# Patient Record
Sex: Male | Born: 1935 | Race: White | Hispanic: No | Marital: Married | Smoking: Never smoker
Health system: Southern US, Community
[De-identification: ages and names within clinical notes are randomized; demographics above are authoritative.]

## PROBLEM LIST (undated history)

## (undated) DIAGNOSIS — G20 Parkinson's disease: Secondary | ICD-10-CM

## (undated) DIAGNOSIS — I10 Essential (primary) hypertension: Secondary | ICD-10-CM

## (undated) DIAGNOSIS — I959 Hypotension, unspecified: Secondary | ICD-10-CM

## (undated) DIAGNOSIS — M8580 Other specified disorders of bone density and structure, unspecified site: Secondary | ICD-10-CM

## (undated) DIAGNOSIS — E785 Hyperlipidemia, unspecified: Secondary | ICD-10-CM

## (undated) DIAGNOSIS — H47619 Cortical blindness, unspecified side of brain: Secondary | ICD-10-CM

## (undated) DIAGNOSIS — F0390 Unspecified dementia without behavioral disturbance: Secondary | ICD-10-CM

## (undated) DIAGNOSIS — G20A1 Parkinson's disease without dyskinesia, without mention of fluctuations: Secondary | ICD-10-CM

## (undated) DIAGNOSIS — I2510 Atherosclerotic heart disease of native coronary artery without angina pectoris: Secondary | ICD-10-CM

## (undated) DIAGNOSIS — I219 Acute myocardial infarction, unspecified: Secondary | ICD-10-CM

## (undated) HISTORY — DX: Acute myocardial infarction, unspecified: I21.9

## (undated) HISTORY — DX: Parkinson's disease without dyskinesia, without mention of fluctuations: G20.A1

## (undated) HISTORY — PX: TOTAL KNEE ARTHROPLASTY: SHX125

## (undated) HISTORY — DX: Essential (primary) hypertension: I10

## (undated) HISTORY — DX: Cortical blindness, unspecified side of brain: H47.619

## (undated) HISTORY — PX: CAROTID STENT: SHX1301

## (undated) HISTORY — DX: Other specified disorders of bone density and structure, unspecified site: M85.80

## (undated) HISTORY — DX: Hypotension, unspecified: I95.9

## (undated) HISTORY — DX: Atherosclerotic heart disease of native coronary artery without angina pectoris: I25.10

## (undated) HISTORY — DX: Hyperlipidemia, unspecified: E78.5

## (undated) HISTORY — DX: Parkinson's disease: G20

---

## 1994-12-22 HISTORY — PX: CORONARY ARTERY BYPASS GRAFT: SHX141

## 2002-12-22 DIAGNOSIS — I219 Acute myocardial infarction, unspecified: Secondary | ICD-10-CM

## 2002-12-22 HISTORY — DX: Acute myocardial infarction, unspecified: I21.9

## 2003-06-20 HISTORY — PX: CARDIAC CATHETERIZATION: SHX172

## 2003-07-31 HISTORY — PX: CORONARY ANGIOPLASTY WITH STENT PLACEMENT: SHX49

## 2003-12-23 LAB — HM COLONOSCOPY: HM Colonoscopy: NORMAL

## 2004-09-18 ENCOUNTER — Encounter: Admission: RE | Admit: 2004-09-18 | Discharge: 2004-09-18 | Payer: Self-pay | Admitting: Internal Medicine

## 2004-10-29 ENCOUNTER — Ambulatory Visit: Payer: Self-pay | Admitting: Internal Medicine

## 2005-01-02 ENCOUNTER — Ambulatory Visit: Payer: Self-pay | Admitting: Internal Medicine

## 2005-02-10 ENCOUNTER — Ambulatory Visit: Payer: Self-pay | Admitting: Internal Medicine

## 2005-06-10 ENCOUNTER — Ambulatory Visit: Payer: Self-pay | Admitting: Internal Medicine

## 2005-10-10 ENCOUNTER — Ambulatory Visit: Payer: Self-pay | Admitting: Internal Medicine

## 2006-02-13 ENCOUNTER — Ambulatory Visit: Payer: Self-pay | Admitting: Internal Medicine

## 2006-08-13 ENCOUNTER — Ambulatory Visit: Payer: Self-pay | Admitting: Internal Medicine

## 2006-09-24 ENCOUNTER — Ambulatory Visit: Payer: Self-pay | Admitting: Internal Medicine

## 2006-12-21 ENCOUNTER — Ambulatory Visit: Payer: Self-pay | Admitting: Internal Medicine

## 2007-02-23 ENCOUNTER — Ambulatory Visit: Payer: Self-pay | Admitting: Internal Medicine

## 2007-07-29 DIAGNOSIS — I2510 Atherosclerotic heart disease of native coronary artery without angina pectoris: Secondary | ICD-10-CM | POA: Insufficient documentation

## 2007-07-29 DIAGNOSIS — G20 Parkinson's disease: Secondary | ICD-10-CM

## 2007-07-29 DIAGNOSIS — I252 Old myocardial infarction: Secondary | ICD-10-CM | POA: Insufficient documentation

## 2007-07-29 DIAGNOSIS — M899 Disorder of bone, unspecified: Secondary | ICD-10-CM | POA: Insufficient documentation

## 2007-07-29 DIAGNOSIS — M949 Disorder of cartilage, unspecified: Secondary | ICD-10-CM

## 2007-08-12 ENCOUNTER — Encounter: Payer: Self-pay | Admitting: Internal Medicine

## 2007-08-26 ENCOUNTER — Ambulatory Visit: Payer: Self-pay | Admitting: Internal Medicine

## 2007-08-26 DIAGNOSIS — E785 Hyperlipidemia, unspecified: Secondary | ICD-10-CM | POA: Insufficient documentation

## 2007-08-26 DIAGNOSIS — I10 Essential (primary) hypertension: Secondary | ICD-10-CM | POA: Insufficient documentation

## 2007-08-27 LAB — CONVERTED CEMR LAB
ALT: 25 units/L (ref 0–53)
AST: 29 units/L (ref 0–37)
Albumin: 3.6 g/dL (ref 3.5–5.2)
Alkaline Phosphatase: 56 units/L (ref 39–117)
BUN: 14 mg/dL (ref 6–23)
Bilirubin, Direct: 0.1 mg/dL (ref 0.0–0.3)
CO2: 29 meq/L (ref 19–32)
Calcium: 9 mg/dL (ref 8.4–10.5)
Chloride: 103 meq/L (ref 96–112)
Cholesterol: 111 mg/dL (ref 0–200)
Creatinine, Ser: 0.8 mg/dL (ref 0.4–1.5)
GFR calc Af Amer: 122 mL/min
GFR calc non Af Amer: 101 mL/min
Glucose, Bld: 88 mg/dL (ref 70–99)
HDL: 44.6 mg/dL (ref >39.0)
LDL Cholesterol: 59 mg/dL (ref 0–99)
Potassium: 4.8 meq/L (ref 3.5–5.1)
Sodium: 136 meq/L (ref 135–145)
Total Bilirubin: 0.8 mg/dL (ref 0.3–1.2)
Total CHOL/HDL Ratio: 2.5
Total Protein: 6.5 g/dL (ref 6.0–8.3)
Triglycerides: 36 mg/dL (ref 0–149)
VLDL: 7 mg/dL (ref 0–40)

## 2007-09-15 ENCOUNTER — Encounter: Payer: Self-pay | Admitting: Internal Medicine

## 2007-12-28 ENCOUNTER — Encounter: Admission: RE | Admit: 2007-12-28 | Discharge: 2008-01-27 | Payer: Self-pay | Admitting: Neurology

## 2008-01-06 ENCOUNTER — Encounter: Payer: Self-pay | Admitting: Internal Medicine

## 2008-02-08 ENCOUNTER — Encounter: Payer: Self-pay | Admitting: Internal Medicine

## 2008-02-23 ENCOUNTER — Ambulatory Visit: Payer: Self-pay | Admitting: Internal Medicine

## 2008-02-28 ENCOUNTER — Ambulatory Visit: Payer: Self-pay | Admitting: Internal Medicine

## 2008-02-28 LAB — CONVERTED CEMR LAB
AST: 26 units/L (ref 0–37)
Albumin: 3.7 g/dL (ref 3.5–5.2)
Alkaline Phosphatase: 57 units/L (ref 39–117)
Calcium: 9.4 mg/dL (ref 8.4–10.5)
Chloride: 106 meq/L (ref 96–112)
Cholesterol: 113 mg/dL (ref 0–200)
GFR calc Af Amer: 85 mL/min
Glucose, Bld: 85 mg/dL (ref 70–99)
HDL: 39.3 mg/dL (ref >39.0)
Potassium: 4.5 meq/L (ref 3.5–5.1)
Sodium: 138 meq/L (ref 135–145)
Total Bilirubin: 0.9 mg/dL (ref 0.3–1.2)
VLDL: 7 mg/dL (ref 0–40)

## 2008-08-10 ENCOUNTER — Encounter: Payer: Self-pay | Admitting: Internal Medicine

## 2008-08-10 ENCOUNTER — Ambulatory Visit: Payer: Self-pay | Admitting: Internal Medicine

## 2008-08-10 DIAGNOSIS — Z8679 Personal history of other diseases of the circulatory system: Secondary | ICD-10-CM

## 2008-08-23 ENCOUNTER — Ambulatory Visit: Payer: Self-pay | Admitting: Internal Medicine

## 2008-09-18 ENCOUNTER — Encounter: Admission: RE | Admit: 2008-09-18 | Discharge: 2008-09-18 | Payer: Self-pay | Admitting: Neurology

## 2008-11-13 ENCOUNTER — Ambulatory Visit: Payer: Self-pay | Admitting: Internal Medicine

## 2008-12-05 ENCOUNTER — Ambulatory Visit: Payer: Self-pay | Admitting: Internal Medicine

## 2008-12-22 DIAGNOSIS — H47619 Cortical blindness, unspecified side of brain: Secondary | ICD-10-CM

## 2008-12-22 HISTORY — DX: Cortical blindness, unspecified side of brain: H47.619

## 2009-01-04 ENCOUNTER — Encounter: Payer: Self-pay | Admitting: Internal Medicine

## 2009-02-05 ENCOUNTER — Encounter: Payer: Self-pay | Admitting: Internal Medicine

## 2009-02-12 ENCOUNTER — Encounter: Payer: Self-pay | Admitting: Internal Medicine

## 2009-05-07 ENCOUNTER — Encounter: Payer: Self-pay | Admitting: Internal Medicine

## 2009-06-05 ENCOUNTER — Ambulatory Visit: Payer: Self-pay | Admitting: Internal Medicine

## 2009-06-05 DIAGNOSIS — M25579 Pain in unspecified ankle and joints of unspecified foot: Secondary | ICD-10-CM

## 2009-08-20 ENCOUNTER — Encounter: Payer: Self-pay | Admitting: Internal Medicine

## 2009-10-14 ENCOUNTER — Inpatient Hospital Stay (HOSPITAL_COMMUNITY): Admission: EM | Admit: 2009-10-14 | Discharge: 2009-10-16 | Payer: Self-pay | Admitting: Emergency Medicine

## 2009-10-15 ENCOUNTER — Encounter (INDEPENDENT_AMBULATORY_CARE_PROVIDER_SITE_OTHER): Payer: Self-pay | Admitting: Neurology

## 2009-11-12 ENCOUNTER — Ambulatory Visit: Payer: Self-pay | Admitting: Internal Medicine

## 2009-11-13 LAB — CONVERTED CEMR LAB
ALT: 11 units/L (ref 0–53)
Albumin: 3.8 g/dL (ref 3.5–5.2)
Alkaline Phosphatase: 54 units/L (ref 39–117)
Basophils Absolute: 0 10*3/uL (ref 0.0–0.1)
Cholesterol: 102 mg/dL (ref 0–200)
Eosinophils Relative: 3.3 % (ref 0.0–5.0)
HCT: 34.1 % — ABNORMAL LOW (ref 39.0–52.0)
HDL: 40 mg/dL (ref >39.00)
Hemoglobin: 11.6 g/dL — ABNORMAL LOW (ref 13.0–17.0)
LDL Cholesterol: 47 mg/dL (ref 0–99)
Lymphocytes Relative: 18.2 % (ref 12.0–46.0)
Lymphs Abs: 1 10*3/uL (ref 0.7–4.0)
MCHC: 34 g/dL (ref 30.0–36.0)
MCV: 101.1 fL — ABNORMAL HIGH (ref 78.0–100.0)
Platelets: 144 10*3/uL — ABNORMAL LOW (ref 150.0–400.0)
RDW: 13.4 % (ref 11.5–14.6)
TSH: 1.35 microintl units/mL (ref 0.35–5.50)
Total Bilirubin: 0.8 mg/dL (ref 0.3–1.2)
Total CHOL/HDL Ratio: 3
Total Protein: 6.3 g/dL (ref 6.0–8.3)
Triglycerides: 76 mg/dL (ref 0.0–149.0)
VLDL: 15.2 mg/dL (ref 0.0–40.0)
WBC: 5.4 10*3/uL (ref 4.5–10.5)

## 2009-11-20 ENCOUNTER — Encounter (INDEPENDENT_AMBULATORY_CARE_PROVIDER_SITE_OTHER): Payer: Self-pay | Admitting: *Deleted

## 2009-11-27 DIAGNOSIS — D649 Anemia, unspecified: Secondary | ICD-10-CM

## 2009-11-27 LAB — CONVERTED CEMR LAB
Folate: 8.6 ng/mL
Saturation Ratios: 18 % — ABNORMAL LOW (ref 20.0–50.0)

## 2009-11-28 ENCOUNTER — Encounter (INDEPENDENT_AMBULATORY_CARE_PROVIDER_SITE_OTHER): Payer: Self-pay | Admitting: *Deleted

## 2009-12-24 ENCOUNTER — Encounter: Payer: Self-pay | Admitting: Internal Medicine

## 2009-12-27 ENCOUNTER — Ambulatory Visit: Payer: Self-pay | Admitting: Gastroenterology

## 2009-12-27 ENCOUNTER — Telehealth: Payer: Self-pay | Admitting: Gastroenterology

## 2010-01-04 ENCOUNTER — Ambulatory Visit: Payer: Self-pay | Admitting: Internal Medicine

## 2010-01-10 ENCOUNTER — Ambulatory Visit: Payer: Self-pay | Admitting: Internal Medicine

## 2010-01-14 ENCOUNTER — Ambulatory Visit: Payer: Self-pay | Admitting: Gastroenterology

## 2010-01-16 ENCOUNTER — Telehealth: Payer: Self-pay | Admitting: Gastroenterology

## 2010-01-16 DIAGNOSIS — E538 Deficiency of other specified B group vitamins: Secondary | ICD-10-CM | POA: Insufficient documentation

## 2010-01-17 ENCOUNTER — Ambulatory Visit: Payer: Self-pay | Admitting: Gastroenterology

## 2010-01-24 ENCOUNTER — Ambulatory Visit: Payer: Self-pay | Admitting: Internal Medicine

## 2010-01-28 ENCOUNTER — Encounter: Payer: Self-pay | Admitting: Internal Medicine

## 2010-01-31 ENCOUNTER — Encounter: Payer: Self-pay | Admitting: Internal Medicine

## 2010-02-14 LAB — CONVERTED CEMR LAB
Basophils Absolute: 0 10*3/uL (ref 0.0–0.1)
Eosinophils Absolute: 0.2 10*3/uL (ref 0.0–0.7)
MCHC: 33 g/dL (ref 30.0–36.0)
MCV: 99.7 fL (ref 78.0–100.0)
Monocytes Absolute: 0.7 10*3/uL (ref 0.1–1.0)
Neutro Abs: 3.6 10*3/uL (ref 1.4–7.7)

## 2010-02-21 ENCOUNTER — Ambulatory Visit: Payer: Self-pay | Admitting: Internal Medicine

## 2010-02-21 LAB — CONVERTED CEMR LAB
Albumin ELP: 58.1 % (ref 55.8–66.1)
Alpha-1-Globulin: 5 % — ABNORMAL HIGH (ref 2.9–4.9)
Alpha-2-Globulin: 11.4 % (ref 7.1–11.8)
Total Protein, Serum Electrophoresis: 6.6 g/dL (ref 6.0–8.3)

## 2010-02-28 ENCOUNTER — Ambulatory Visit: Payer: Self-pay | Admitting: Internal Medicine

## 2010-03-19 ENCOUNTER — Ambulatory Visit: Payer: Self-pay | Admitting: Internal Medicine

## 2010-03-21 ENCOUNTER — Encounter: Payer: Self-pay | Admitting: Internal Medicine

## 2010-03-22 ENCOUNTER — Telehealth: Payer: Self-pay | Admitting: Internal Medicine

## 2010-04-04 ENCOUNTER — Encounter: Admission: RE | Admit: 2010-04-04 | Discharge: 2010-05-30 | Payer: Self-pay | Admitting: *Deleted

## 2010-04-06 ENCOUNTER — Encounter: Payer: Self-pay | Admitting: Internal Medicine

## 2010-06-20 ENCOUNTER — Encounter: Payer: Self-pay | Admitting: Internal Medicine

## 2010-09-12 ENCOUNTER — Ambulatory Visit: Payer: Self-pay | Admitting: Internal Medicine

## 2010-12-12 ENCOUNTER — Encounter: Payer: Self-pay | Admitting: Internal Medicine

## 2011-01-22 NOTE — Assessment & Plan Note (Signed)
Summary: 2 WK ROV // RS   Vital Signs:  Patient profile:   77 year old male Weight:      137 pounds Pulse rate:   84 / minute Pulse rhythm:   regular Resp:     12 per minute BP sitting:   86 / 56  (left arm)  Vitals Entered By: Gladis Riffle, RN (March 19, 2010 1:22 PM) CC: 2 week rov--states hallucinations are improved, but balance unsteady and seems tired--has fallen x 2 Is Patient Diabetic? No Comments sleeping with alprazolam, but wets bed   Primary Care Provider:  Birdie Sons, MD  CC:  2 week rov--states hallucinations are improved and but balance unsteady and seems tired--has fallen x 2.  History of Present Illness: he is feeling much better confirmed with wife essentially no hallucinations mood better staying awake during the day sleeping better at night see previous note form medication adjustments  All other systems reviewed and were negative   Preventive Screening-Counseling & Management  Alcohol-Tobacco     Smoking Status: never  Current Problems (verified): 1)  B12 Deficiency  (ICD-266.2) 2)  Rectal Bleeding  (ICD-569.3) 3)  Anemia  (ICD-285.9) 4)  Ankle Pain  (ICD-719.47) 5)  Hypotension, Orthostatic, Hx of  (ICD-V12.50) 6)  Hypertension  (ICD-401.9) 7)  Hyperlipidemia  (ICD-272.4) 8)  Parkinson's Disease  (ICD-332.0) 9)  Solar Keratosis  (ICD-702.0) 10)  Osteopenia  (ICD-733.90) 11)  Myocardial Infarction, Hx of  (ICD-412) 12)  Coronary Artery Disease  (ICD-414.00)  Current Medications (verified): 1)  Aspirin 81 Mg  Tabs (Aspirin) .... Take 1 Tablet By Mouth Once A Day 2)  Plavix 75 Mg  Tabs (Clopidogrel Bisulfate) .... Take 1 Tablet By Mouth Once A Day 3)  Sinemet 25-100 Mg Tabs (Carbidopa-Levodopa) .... Take 1 Tablet By Mouth Three Times A Day 4)  Alprazolam 0.25 Mg Tabs (Alprazolam) .Marland Kitchen.. 1-2 At Bedtime 5)  Nascobal 500 Mcg/0.69ml Soln (Cyanocobalamin) .... One Spray, One Nostril, Once A Week 6)  Vitamin C 500 Mg Tabs (Ascorbic Acid) .... Once  Daily 7)  Lisinopril 5 Mg Tabs (Lisinopril) .Marland Kitchen.. 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   Head:  normocephalic and atraumatic.   Eyes:  pupils equal and pupils round.   Neck:  supple, full ROM, and no masses.   Chest Wall:  no deformities, no tenderness, and no masses.   Lungs:  normal respiratory effort and no intercostal retractions.   Heart:  normal rate and regular rhythm.   Abdomen:  soft and non-tender.   Msk:  No deformity or scoliosis noted of thoracic or lumbar spine.   Neurologic:  flat facies  resitng hand tremor slow, shuffling gait alert and oriented to name place, time Skin:  turgor normal and color normal.   Psych:  normally interactive and good  eye contact.     Impression & Recommendations:  Problem # 1:  PARKINSON'S DISEASE (ICD-332.0) sxs are improved even on lesser dose of sinemet sxs related to meds (hallucinations, etc) resolved will leve the meds the same for now---see me one month  discussed with patient and wife  Complete Medication List: 1)  Aspirin 81 Mg Tabs (Aspirin) .... Take 1 tablet by mouth once a day 2)  Plavix 75  Mg Tabs (Clopidogrel bisulfate) .... Take 1 tablet by mouth once a day 3)  Sinemet 25-100 Mg Tabs (Carbidopa-levodopa) .... Take 1 tablet by mouth three times a day 4)  Alprazolam 0.25 Mg Tabs (Alprazolam) .Marland Kitchen.. 1-2 at bedtime 5)  Nascobal 500 Mcg/0.14ml Soln (Cyanocobalamin) .... One spray, one nostril, once a week 6)  Vitamin C 500 Mg Tabs (Ascorbic acid) .... Once daily 7)  Lisinopril 5 Mg Tabs (Lisinopril) .Marland Kitchen.. 1 tablet by mouth daily

## 2011-01-22 NOTE — Assessment & Plan Note (Signed)
Summary: B12 (#3 of 3 weekly inj)dfs  Nurse Visit   Allergies: No Known Drug Allergies  Medication Administration  Injection # 1:    Medication: Vit B12 1000 mcg    Diagnosis: B12 DEFICIENCY (ICD-266.2)    Route: IM    Site: R deltoid    Exp Date: 09/22/2011    Lot #: [id number]    Mfr: American Regent    Patient tolerated injection without complications    Given by: Harlow Mares CMA (AAMA) (January 17, 2010 9:14 AM)  Orders Added: 1)  Vit B12 1000 mcg [J3420] Prescriptions: NASCOBAL 500 MCG/0.1ML SOLN (CYANOCOBALAMIN) one spray, one nostril, once a week  #1 bottle x 6   Entered by:   Harlow Mares CMA (AAMA)   Authorized by:   Mardella Layman MD [facility]   Signed by:   Harlow Mares CMA (AAMA) on 01/17/2010   Method used:   Print then Give to Patient   RxID:   [id number]

## 2011-01-22 NOTE — Procedures (Signed)
Summary: Colonoscopy  Patient: Keno Caraway Note: All result statuses are Final unless otherwise noted.  Tests: (1) Colonoscopy (COL)   COL Colonoscopy           DONE     [facility]     [address]     [place], [place]  [address]           COLONOSCOPY PROCEDURE REPORT           PATIENT:  Everton, Bertha  MR#:  [id number]     BIRTHDATE:  [birth year], 76 yrs. old  GENDER:  male           ENDOSCOPIST:  Vania Rea. Jarold Motto, MD, [facility]     Referred by:           PROCEDURE DATE:  01/14/2010     PROCEDURE:  Colonoscopy, Diagnostic     ASA CLASS:  Class III     INDICATIONS:  Iron Deficiency Anemia, rectal bleeding           MEDICATIONS:   Fentanyl 50 mcg IV, Versed 6 mg           DESCRIPTION OF PROCEDURE:   After the risks benefits and     alternatives of the procedure were thoroughly explained, informed     consent was obtained.  Digital rectal exam was performed and     revealed no abnormalities.   The LB [id number] endoscope     was introduced through the anus and advanced to the cecum, which     was identified by both the appendix and ileocecal valve, without     limitations.  The quality of the prep was excellent, using     MoviPrep.  The instrument was then slowly withdrawn as the colon     was fully examined.     <<PROCEDUREIMAGES>>           FINDINGS:  Scattered diverticula were found sigmoid to descending     No polyps or cancers were seen.  This was otherwise a normal     examination of the colon.   Retroflexed views in the rectum     revealed hemorrhoids.    The scope was then withdrawn from the     patient and the procedure completed.           COMPLICATIONS:  None           ENDOSCOPIC IMPRESSION:     1) Diverticula, scattered in the sigmoid to descending     2) No polyps or cancers     3) Otherwise normal examination     4) Hemorrhoids     Pt.is on Plavix.will resume.     RECOMMENDATIONS:     1) high fiber diet     prn local anal care.        REPEAT EXAM:  No           ______________________________     Vania Rea. Jarold Motto, MD, [facility]           CC:  Lindley Magnus, MD           n.     Rosalie DoctorVania Rea. Humphrey Guerreiro at 01/14/2010 09:03 AM           Rachel Moulds, [id number]  Note: An exclamation mark (!) indicates a result that was not dispersed into the flowsheet. Document Creation Date: 01/14/2010 9:03 AM _______________________________________________________________________  (1) Order result status: Final Collection or observation date-time:  01/14/2010 08:55 Requested date-time:  Receipt date-time:  Reported date-time:  Referring Physician:   Ordering Physician: Sheryn Bison [phone]) Specimen Source:  Source: Launa Grill Order Number: [id number] Lab site:

## 2011-01-22 NOTE — Assessment & Plan Note (Signed)
Summary: B-12 INJ/RCD  Nurse Visit   Allergies: No Known Drug Allergies  Medication Administration  Injection # 1:    Medication: Vit B12 1000 mcg    Diagnosis: ANEMIA (ICD-285.9)    Route: IM    Site: L deltoid    Exp Date: 07/23/2011    Lot #: [id number]    Mfr: American Regent    Patient tolerated injection without complications    Given by: Kern Reap CMA (AAMA) (January 10, 2010 2:58 PM)  Orders Added: 1)  Vit B12 1000 mcg [J3420] 2)  Admin of Therapeutic Inj  intramuscular or subcutaneous [96372]  Appended Document: B-12 INJ/RCD after shots would Rx. weekly Nascobal B12 spray...  Appended Document: B-12 INJ/RCD Wife notified.  Rx sent for nascobal.   Clinical Lists Changes  Medications: Added new medication of NASCOBAL 500 MCG/0.1ML SOLN (CYANOCOBALAMIN) 1 spray weekly - Signed Rx of NASCOBAL 500 MCG/0.1ML SOLN (CYANOCOBALAMIN) 1 spray weekly;  #3 x 3;  Signed;  Entered by: Ashok Cordia RN;  Authorized by: Mardella Layman MD [facility];  Method used: [facility] to [facility]  [phone]*, [address], [place], [place]  [address], Ph: [phone] or [phone], Fax: [phone]    Prescriptions: NASCOBAL 500 MCG/0.1ML SOLN (CYANOCOBALAMIN) 1 spray weekly  #3 x 3   Entered by:   Ashok Cordia RN   Authorized by:   Mardella Layman MD [facility]   Signed by:   Ashok Cordia RN on 01/16/2010   Method used:   [facility] to        [facility]  [phone]* (retail)       [address] [place], [place]  [address]       Ph: [phone] or [phone]       Fax: [phone]   RxID:   [id number]

## 2011-01-22 NOTE — Letter (Signed)
Summary: Attending Physician's Statement/[employer]  Attending Physician's Statement/[employer]   Imported By: Maryln Gottron 03/26/2010 10:55:23  _____________________________________________________________________  External Attachment:    Type:   Image     Comment:   External Document

## 2011-01-22 NOTE — Letter (Signed)
Summary: [facility] Instructions  [facility]  [address] [place], [place] [address]   Phone: [phone]  Fax: [phone]       HEMI CHACKO    [birth year]    MRN: [id number]        Procedure Day /Date: Monday 01/14/10     Arrival Time: 7:30      Procedure Time:8:30     Location of Procedure:                    _X _  [facility] (4th Floor)                        PREPARATION FOR COLONOSCOPY WITH MOVIPREP   Starting 5 days prior to your procedure 01/09/10 do not eat nuts, seeds, popcorn, corn, beans, peas,  salads, or any raw vegetables.  Do not take any fiber supplements (e.g. Metamucil, Citrucel, and Benefiber).  THE DAY BEFORE YOUR PROCEDURE         DATE: 01/13/10  DAY: Sunday  1.  Drink clear liquids the entire day-NO SOLID FOOD  2.  Do not drink anything colored red or purple.  Avoid juices with pulp.  No orange juice.  3.  Drink at least 64 oz. (8 glasses) of fluid/clear liquids during the day to prevent dehydration and help the prep work efficiently.  CLEAR LIQUIDS INCLUDE: Water Jello Ice Popsicles Tea (sugar ok, no milk/cream) Powdered fruit flavored drinks Coffee (sugar ok, no milk/cream) Gatorade Juice: apple, white grape, white cranberry  Lemonade Clear bullion, consomm, broth Carbonated beverages (any kind) Strained chicken noodle soup Hard Candy                             4.  In the morning, mix first dose of MoviPrep solution:    Empty 1 Pouch A and 1 Pouch B into the disposable container    Add lukewarm drinking water to the top line of the container. Mix to dissolve    Refrigerate (mixed solution should be used within 24 hrs)  5.  Begin drinking the prep at 5:00 p.m. The MoviPrep container is divided by 4 marks.   Every 15 minutes drink the solution down to the next mark (approximately 8 oz) until the full liter is complete.   6.  Follow completed prep with 16 oz of clear liquid of your choice (Nothing red or  purple).  Continue to drink clear liquids until bedtime.  7.  Before going to bed, mix second dose of MoviPrep solution:    Empty 1 Pouch A and 1 Pouch B into the disposable container    Add lukewarm drinking water to the top line of the container. Mix to dissolve    Refrigerate  THE DAY OF YOUR PROCEDURE      DATE: 01/14/10 DAY: Monday  Beginning at 3:30a.m. (5 hours before procedure):         1. Every 15 minutes, drink the solution down to the next mark (approx 8 oz) until the full liter is complete.  2. Follow completed prep with 16 oz. of clear liquid of your choice.    3. You may drink clear liquids until 6"30 (2 HOURS BEFORE PROCEDURE).   MEDICATION INSTRUCTIONS  Unless otherwise instructed, you should take regular prescription medications with a small sip of water   as early as possible the morning of your procedure.  Stop taking Plavix on _________ .  We will check with Dr. Allyson Sabal about this.  (5 days before procedure).  You will be contacted by our office prior to your procedure for directions on holding your Plavix.  If you do not hear from our office 1 week prior to your scheduled procedure, please call [phone] to discuss.           OTHER INSTRUCTIONS  You will need a responsible adult at least 75 years of age to accompany you and drive you home.   This person must remain in the waiting room during your procedure.  Wear loose fitting clothing that is easily removed.  Leave jewelry and other valuables at home.  However, you may wish to bring a book to read or  an iPod/MP3 player to listen to music as you wait for your procedure to start.  Remove all body piercing jewelry and leave at home.  Total time from sign-in until discharge is approximately 2-3 hours.  You should go home directly after your procedure and rest.  You can resume normal activities the  day after your procedure.  The day of your procedure you should not:   Drive   Make legal  decisions   Operate machinery   Drink alcohol   Return to work  You will receive specific instructions about eating, activities and medications before you leave.    The above instructions have been reviewed and explained to me by   _______________________    I fully understand and can verbalize these instructions _____________________________ Date _________

## 2011-01-22 NOTE — Progress Notes (Signed)
Summary: B-12 shot  Phone Note Call from Patient Call back at Home Phone [phone]   Caller: Patient Call For: Dr. Jarold Motto Reason for Call: Talk to Nurse Summary of Call: Pt. is wanting to know if he can come in tomorrow for a B12 shot. He usually has it done closer to there house and they cannot do it until later Initial call taken by: Karna Christmas,  January 16, 2010 9:26 AM  Follow-up for Phone Call        Appt sch for pt to get B12 tomorrow.  last of 3 weekly injections.  PT will begin nascobal. Follow-up by: Ashok Cordia RN,  January 16, 2010 9:32 AM  New Problems: B12 DEFICIENCY (ICD-266.2)   New Problems: B12 DEFICIENCY (ICD-266.2)

## 2011-01-22 NOTE — Progress Notes (Signed)
Summary: Plavix and colonoscopy  Phone Note Outgoing Call   Summary of Call: Called Dr. Hazle Coca office.  Spoke with nurse re Plavix instructions and colonoscopy.  Nurse will call back and fax written instructions.   Need to set up B12 inj weekly x3. Initial call taken by: Ashok Cordia RN,  December 27, 2009 12:55 PM  Follow-up for Phone Call        Per note from Dr. Allyson Sabal.  May hold Plavix for 5 days prior to proc and restart asap.  Left message for pt to call.  Lupita Leash Surface RN  December 28, 2009 3:58 PM  Spoke with wife.  Informed of Dr. Hazle Coca instructions re Plavix.  Wife asks if pt can get B12 inj at Dr. Rolan Lipa' office.   Much more convenient for pt.   Follow-up by: Ashok Cordia RN,  December 31, 2009 9:39 AM  Additional Follow-up for Phone Call Additional follow up Details #1::        Called Dr. Marliss Coots office.  Fax order to Gladis Riffle at [phone].   Lupita Leash Surface RN  December 31, 2009 9:44 AM  Order faxed to Dr. Cato Mulligan office.  WIfe notified.   Additional Follow-up by: Ashok Cordia RN,  December 31, 2009 9:53 AM

## 2011-01-22 NOTE — Progress Notes (Signed)
Summary: Medications, Blood Pressures, Problems per patient  Medications, Blood Pressures, Problems per patient   Imported By: Maryln Gottron 03/21/2010 15:25:06  _____________________________________________________________________  External Attachment:    Type:   Image     Comment:   External Document

## 2011-01-22 NOTE — Letter (Signed)
Summary: [facility]  [facility]   Imported By: Maryln Gottron 01/03/2010 14:19:33  _____________________________________________________________________  External Attachment:    Type:   Image     Comment:   External Document

## 2011-01-22 NOTE — Letter (Signed)
Summary: [facility] at [facility]   Imported By: Maryln Gottron 07/12/2010 10:22:52  _____________________________________________________________________  External Attachment:    Type:   Image     Comment:   External Document

## 2011-01-22 NOTE — Assessment & Plan Note (Signed)
Summary: B12 INJ // RS  Nurse Visit   Allergies: No Known Drug Allergies  Medication Administration  Injection # 1:    Medication: Vit B12 1000 mcg    Diagnosis: ANEMIA (ICD-285.9)    Route: IM    Site: R deltoid    Exp Date: 07/23/2011    Lot #: [id number]    Mfr: American Regent    Patient tolerated injection without complications    Given by: Gladis Riffle, RN (January 04, 2010 3:15 PM)  Orders Added: 1)  Vit B12 1000 mcg [J3420] 2)  Admin of Therapeutic Inj  intramuscular or subcutaneous [96372]   Medication Administration  Injection # 1:    Medication: Vit B12 1000 mcg    Diagnosis: ANEMIA (ICD-285.9)    Route: IM    Site: R deltoid    Exp Date: 07/23/2011    Lot #: [id number]    Mfr: American Regent    Patient tolerated injection without complications    Given by: Gladis Riffle, RN (January 04, 2010 3:15 PM)  Orders Added: 1)  Vit B12 1000 mcg [J3420] 2)  Admin of Therapeutic Inj  intramuscular or subcutaneous [[id number]]

## 2011-01-22 NOTE — Assessment & Plan Note (Signed)
Summary: ankle pain//ccm   Vital Signs:  Patient profile:   77 year old male Height:      67 inches (170.18 cm) Weight:      135 pounds (61.36 kg) Temp:     98.6 degrees F (37.00 degrees C) oral Pulse rate:   82 / minute BP sitting:   90 / 60  (left arm) Cuff size:   regular  Vitals Entered By: Josph Macho RMA (September 12, 2010 11:47 AM) CC: right Ankle pain Xseveral months/ CF Is Patient Diabetic? No   Primary Care Provider:  Birdie Sons, MD  CC:  right Ankle pain Xseveral months/ CF.  History of Present Illness: patient comes in with right ankle discomfort. This has been going on for quite some time. This is not been extensively evaluated or treated prior because of difficulty with Parkinson's disease and medication side effects. He is doing much better from a Parkinson's point of view. He is being followed at [facility]. Tolerating medications.  In regards to his ankle, he reports right-sided ankle pain. Mostly laterally. Increases with walking. He does not recall any trauma. No extensive swelling or erythema noted. No history of gout.  All other systems reviewed and were negative   Current Problems (verified): 1)  B12 Deficiency  (ICD-266.2) 2)  Anemia  (ICD-285.9) 3)  Ankle Pain  (ICD-719.47) 4)  Hypotension, Orthostatic, Hx of  (ICD-V12.50) 5)  Hypertension  (ICD-401.9) 6)  Hyperlipidemia  (ICD-272.4) 7)  Parkinson's Disease  (ICD-332.0) 8)  Solar Keratosis  (ICD-702.0) 9)  Osteopenia  (ICD-733.90) 10)  Myocardial Infarction, Hx of  (ICD-412) 11)  Coronary Artery Disease  (ICD-414.00)  Current Medications (verified): 1)  Aspirin 81 Mg  Tabs (Aspirin) .... Take 1 Tablet By Mouth Once A Day 2)  Plavix 75 Mg  Tabs (Clopidogrel Bisulfate) .... Take 1 Tablet By Mouth Once A Day 3)  Sinemet 25-100 Mg Tabs (Carbidopa-Levodopa) .... Take 1 Tablet By Mouth Three Times A Day 4)  Vitamin C 500 Mg Tabs (Ascorbic Acid) .... Once Daily 5)  Lisinopril 5 Mg  Tabs (Lisinopril) .Marland Kitchen.. 1 Tablet By Mouth Daily 6)  Rivastagmine 3mg  .... Two Times A Day 7)  Lipitor 80 Mg Tabs (Atorvastatin Calcium) .Marland Kitchen.. 1 At Bedtime 8)  Niaspan 500 Mg Cr-Tabs (Niacin (Antihyperlipidemic)) .Marland Kitchen.. 1 At Bedtime 9)  Mirtazapine 15 Mg Tabs (Mirtazapine) .Marland Kitchen.. 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 no    Any prior history of allergic reactions to egg and/or gelatin? no    Do you have a sensitivity to the preservative Thimersol? no    Do you have a past history of Guillan-Barre Syndrome? no    Do you currently have an acute febrile illness? no    Have you ever had a severe reaction to latex? no  Vaccine information given and explained to patient? yes    Are you currently pregnant? no    Lot Number:[id number]   Exp Date:06/21/2011   Site Given  Left Deltoid IM Josph Macho RMA  September 12, 2010 11:48 AM   Physical Exam  General:  well-developed well-nourished male in no acute distress. He has a resting tremor. Speech is somewhat soft and stuttered. HEENT exam atraumatic, normocephalic. Neck is supple. Chest clear to auscultation without increased  work of breathing. Extremities he has full range of motion of both ankles. No significant swelling or erythema noted. No tenderness to palpation.   Impression & Recommendations:  Problem # 1:  ANKLE PAIN (ICD-719.47) ankle pain unclear etiology. Will try to treat with brace call if sxs persist reviewed xray with pt and wife  Complete Medication List: 1)  Aspirin 81 Mg Tabs (Aspirin) .... Take 1 tablet by mouth once a day 2)  Plavix 75 Mg Tabs (Clopidogrel bisulfate) .... Take 1 tablet by mouth once a day 3)  Sinemet 25-100 Mg Tabs (Carbidopa-levodopa) .... Take 1 tablet by mouth three times a day 4)  Vitamin C 500 Mg Tabs (Ascorbic acid) .... Once daily 5)  Lisinopril 5 Mg Tabs (Lisinopril) .Marland Kitchen.. 1 tablet by mouth daily 6)  Rivastagmine 3mg   .... Two times a day 7)  Lipitor 80 Mg Tabs (Atorvastatin calcium) .Marland Kitchen.. 1 at bedtime 8)  Niaspan 500 Mg Cr-tabs (Niacin (antihyperlipidemic)) .Marland Kitchen.. 1 at bedtime 9)  Mirtazapine 15 Mg Tabs (Mirtazapine) .Marland Kitchen.. 1 at bedtime  Other Orders: Flu Vaccine 16yrs + MEDICARE PATIENTS (Z6109) Administration Flu vaccine - MCR (G0008)

## 2011-01-22 NOTE — Letter (Signed)
Summary: [facility]  [facility]   Imported By: Maryln Gottron 02/05/2010 13:49:28  _____________________________________________________________________  External Attachment:    Type:   Image     Comment:   External Document

## 2011-01-22 NOTE — Letter (Signed)
Summary: [facility] at [facility] at [facility]   Imported By: Maryln Gottron 04/11/2010 11:31:51  _____________________________________________________________________  External Attachment:    Type:   Image     Comment:   External Document

## 2011-01-22 NOTE — Progress Notes (Signed)
Summary: Medication change   LMTCB 03/27/2010  Phone Note Call from Patient Call back at Home Phone [phone]   Caller: Spouse Reason for Call: Talk to Nurse Summary of Call: Patient's spouse states Dr. Cato Mulligan was going to call her with a possible change in medication after consulting with patient's neurologist.  Please call patient Initial call taken by: Everrett Coombe,  March 22, 2010 2:22 PM  Follow-up for Phone Call        wife cb she is aware dr Bristyn Kulesza out of office. Follow-up by: Heron Sabins,  March 22, 2010 2:56 PM  Additional Follow-up for Phone Call Additional follow up Details #1::        call patient's wife , see how he is doing. If doing well I would like to leave meds the same  pt's wife notified. Lynann Beaver [facility]  March 29, 2010 2:13 PM' Additional Follow-up by: Birdie Sons MD,  March 27, 2010 3:52 PM    Additional Follow-up for Phone Call Additional follow up Details #2::    [facility]  LMTCB [facility] CMA  March 29, 2010 11:35 AM  Follow-up by: Lynann Beaver CMA,  March 27, 2010 4:04 PM

## 2011-01-22 NOTE — Assessment & Plan Note (Signed)
Summary: ANEMIA/CH.   History of Present Illness Visit Type: consult Primary GI MD: Sheryn Bison MD FACP FAGA Primary Provider: Birdie Sons, MD Chief Complaint: Anemia, rectal bleeding History of Present Illness:   [name] is a 77 year old white male with advancing Parkinson's disease, severe coronary artery disease with previous stenting with metal stents, hypertension, and severe osteoarthritis with previous bilateral knee replacements. He is referred today by Dr. Cato Mulligan for evaluation of mild iron deficiency anemia with a hemoglobin of 11.6, serum ferritin of 65, iron saturation of 18%, and a low serum B12 228 with a macrocytic red cell pattern.  Patient has rather frequent bright red blood per rectum and is on Plavix and aspirin. He had a negative colonoscopy 10 years ago in [place]. He denies abdominal pain or other gastrointestinal symptomatology or any history of pancreatitis, hepatitis, or hepatobiliary symptomatology. His appetite is good and his weight is stable. Family history is noncontributory.  He is followed closely by neurology and Dr. Anne Hahn for advancing work as his disease and is on Requip and Sinemet. He seem by Dr. Gery Pray in cardiology and is on atenolol 25 mg a day, Plavix 75 mg a day, lisinopril 5 mg a day, and alprazolam at bedtime. He denies current cardiovascular or pulmonary symptomatology or symptoms of anemia.   GI Review of Systems      Denies abdominal pain, acid reflux, belching, bloating, chest pain, dysphagia with liquids, dysphagia with solids, heartburn, loss of appetite, nausea, vomiting, vomiting blood, weight loss, and  weight gain.      Reports hemorrhoids and  rectal bleeding.     Denies anal fissure, black tarry stools, change in bowel habit, constipation, diarrhea, diverticulosis, fecal incontinence, heme positive stool, irritable bowel syndrome, jaundice, light color stool, liver problems, and  rectal pain.    Current Medications  (verified): 1)  Atenolol 25 Mg  Tabs (Atenolol) .... Take 1 Tablet By Mouth At Bedtime 2)  Lipitor 80 Mg  Tabs (Atorvastatin Calcium) .... Take 1 Tablet By Mouth Once A Day 3)  Aspirin 81 Mg  Tabs (Aspirin) .... Take 1 Tablet By Mouth Once A Day 4)  Plavix 75 Mg  Tabs (Clopidogrel Bisulfate) .... Take 1 Tablet By Mouth Once A Day 5)  Requip 3 Mg Tabs (Ropinirole Hcl) .... Take 1 Tablet By Mouth Three Times A Day 6)  Niaspan 500 Mg  Tbcr (Niacin (Antihyperlipidemic)) .... Take 1 Tablet By Mouth At Bedtime 7)  Lisinopril 5 Mg Tabs (Lisinopril) .... Take 1 Tablet By Mouth Once A Day 8)  Sinemet Cr 50-200 Mg Cr-Tabs (Carbidopa-Levodopa) .... Take 1 Tablet By Mouth Three Times A Day 9)  Alprazolam 0.25 Mg Tabs (Alprazolam) .Marland Kitchen.. 1-2 At Bedtime 10)  Allergy Shots .... Weekly  Allergies (verified): No Known Drug Allergies  Past History:  Past medical, surgical, family and social histories (including risk factors) reviewed for relevance to current acute and chronic problems.  Past Medical History: Reviewed history from 08/10/2008 and no changes required. Coronary artery disease Myocardial infarction, hx of Osteopenia Hyperlipidemia Hypertension Parkinson's disease  Past Surgical History: Reviewed history from 07/29/2007 and no changes required. Coronary artery bypass graft Bilat Knee replacement Stent x3  Family History: Reviewed history from 07/29/2007 and no changes required. Family History of Arthritis Family History Hypertension Family History of Cardiovascular disorder  Social History: Reviewed history from 07/29/2007 and no changes required. Retired-Chemist Married, 2 girls Never Smoked Alcohol use-yes Drug use-no Regular exercise-yes  Review of Systems General:  Complains of  sleep disorder; denies fever, chills, sweats, anorexia, fatigue, weakness, malaise, and weight loss. Eyes:  Complains of blurring; denies diplopia, irritation, discharge, vision loss, scotoma, eye  pain, and photophobia. ENT:  Complains of decreased hearing and hoarseness; denies earache, ear discharge, tinnitus, nasal congestion, loss of smell, nosebleeds, sore throat, and difficulty swallowing. CV:  Denies chest pains, angina, palpitations, syncope, dyspnea on exertion, orthopnea, PND, peripheral edema, and claudication. Resp:  Denies dyspnea at rest, dyspnea with exercise, cough, sputum, wheezing, coughing up blood, and pleurisy. GI:  Denies difficulty swallowing, pain on swallowing, nausea, indigestion/heartburn, vomiting, vomiting blood, abdominal pain, jaundice, gas/bloating, diarrhea, constipation, change in bowel habits, bloody BM's, black BMs, and fecal incontinence. GU:  Denies urinary burning, blood in urine, urinary frequency, urinary hesitancy, nocturnal urination, urinary incontinence, penile discharge, genital sores, decreased libido, and erectile dysfunction. MS:  Denies joint pain / LOM, joint swelling, joint stiffness, joint deformity, low back pain, muscle weakness, muscle cramps, muscle atrophy, leg pain at night, leg pain with exertion, and shoulder pain / LOM hand / wrist pain (CTS); previous bilateral knee replacements in [place]. Derm:  Complains of itching; denies rash, dry skin, hives, moles, warts, and unhealing ulcers; positive for easy bruising.. Neuro:  Complains of weakness; denies paralysis, abnormal sensation, seizures, syncope, tremors, vertigo, transient blindness, frequent falls, frequent headaches, difficulty walking, headache, sciatica, radiculopathy other:, restless legs, memory loss, and confusion. Psych:  Complains of memory loss; denies depression, anxiety, suicidal ideation, hallucinations, paranoia, phobia, and confusion. Endo:  Denies cold intolerance, heat intolerance, polydipsia, polyphagia, polyuria, unusual weight change, and hirsutism. Heme:  Complains of bruising; denies bleeding, enlarged lymph nodes, and pagophagia. Allergy:  Complains  of hay fever; denies hives, rash, sneezing, and recurrent infections.  Vital Signs:  Patient profile:   77 year old male Height:      67 inches Weight:      143 pounds BMI:     22.48 Pulse rate:   64 / minute Pulse rhythm:   regular BP sitting:   80 / 52  (left arm) Cuff size:   regular  Vitals Entered By: Francee Piccolo CMA Duncan Dull) (December 27, 2009 9:47 AM)  Physical Exam  General:  Well developed, well nourished, no acute distress.healthy appearing.   Head:  Normocephalic and atraumatic. Eyes:  PERRLA, no icterus.exam deferred to patient's ophthalmologist.   Neck:  Supple; no masses or thyromegaly. Lungs:  Clear throughout to auscultation. Heart:  Regular rate and rhythm; no murmurs, rubs,  or bruits. Abdomen:  Soft, nontender and nondistended. No masses, hepatosplenomegaly or hernias noted. Normal bowel sounds. Rectal:  Normal exam.hemocult negative.   Prostate:  .normal size prostate.   Msk:  Symmetrical with no gross deformities. Normal posture.arthritic changes.   Extremities:  No clubbing, cyanosis, edema or deformities noted.trace pedal edema.   Neurologic:  Alert and  oriented x4;  grossly normal neurologically. Cervical Nodes:  No significant cervical adenopathy. Inguinal Nodes:  No significant inguinal adenopathy. Psych:  Alert and cooperative. Normal mood and affect.poor concentration.     Impression & Recommendations:  Problem # 1:  ANEMIA (ICD-285.9) Assessment Unchanged He has a macrocytic anemia and borderline depressed serum B12 level. I will treat him with parenteral B12 or placement therapy pending further GI evaluation. As mentioned above, he has multiple neurological problems revolving around advancing Parkinson's disease.  Problem # 2:  PARKINSON'S DISEASE (ICD-332.0) Assessment: Deteriorated continue medications and followup with neurology as planned  Problem # 3:  RECTAL BLEEDING (ICD-569.3) Assessment: Deteriorated  I am concerned with his  symptomatology her brother frequent rectal bleeding that he probably has sigmoid polyps or carcinoma. Have scheduled him for colonoscopy with a balanced electrolyte preparation. We will adjust his anticoagulants by holding his Plavix 5 days before his procedure unless otherwise advised by Dr. Allyson Sabal. We will continue his salicylate therapy. The risk and benefit of colonoscopy and polypectomy have been explained to the patient and his wife in detail. We'll not treat with oral iron therapy at this time.  Problem # 4:  CORONARY ARTERY DISEASE (ICD-414.00) Assessment: Unchanged Continue Multiple Cardiac Medications per cardiology with the coagulation adjustments as listed above his blood pressure today is low at 80/52, and his records review a history of chronic hypertension. Pulse today was 64 and regular. Cardiac exam showed an regular rhythm without other abnormalities.  Patient Instructions: 1)  Conscious Sedation brochure given.  2)  Colonoscopy and Flexible Sigmoidoscopy brochure given.  3)  Copy sent to : Dr. Birdie Sons and Dr. Nanetta Batty 4)  Please continue current medications.  5)  Start B12 injections. 6)  Hold Plavix 5 days before colonoscopy but continue aspirin therapy 7)  The medication list was reviewed and reconciled.  All changed / newly prescribed medications were explained.  A complete medication list was provided to the patient / caregiver.  Appended Document: ANEMIA/CH.    Clinical Lists Changes  Medications: Added new medication of MOVIPREP 100 GM  SOLR (PEG-KCL-NACL-NASULF-NA ASC-C) As per prep instructions. - Signed Added new medication of CYANOCOBALAMIN 1000 MCG/ML INJ SOLN (CYANOCOBALAMIN) 1 cc IM weekly X 3 then 1 cc monthly Rx of MOVIPREP 100 GM  SOLR (PEG-KCL-NACL-NASULF-NA ASC-C) As per prep instructions.;  #1 x 0;  Signed;  Entered by: Ashok Cordia RN;  Authorized by: Mardella Layman MD [facility];  Method used: [facility]  [phone]*,  [address] [place], [place]  [address], Ph: [phone] or [phone], Fax: [phone] Orders: Added new Test order of Colonoscopy (Colon) - Signed    Prescriptions: MOVIPREP 100 GM  SOLR (PEG-KCL-NACL-NASULF-NA ASC-C) As per prep instructions.  #1 x 0   Entered by:   Ashok Cordia RN   Authorized by:   Mardella Layman MD [facility]   Signed by:   Ashok Cordia RN on 12/27/2009   Method used:   [facility]        [facility]  [phone]* (retail)       [address] [place], [place]  [address]       Ph: [phone] or [phone]       Fax: [phone]   RxID:   [id number]

## 2011-01-22 NOTE — Assessment & Plan Note (Signed)
Summary: consult re: ?dementia/parkinsons/cjr   Vital Signs:  Patient profile:   77 year old male Weight:      140 pounds Temp:     98 degrees F oral Pulse rate:   72 / minute Pulse rhythm:   regular Resp:     12 per minute BP sitting:   90 / 52  (left arm)  Vitals Entered By: Gladis Riffle, RN (February 28, 2010 10:57 AM) CC: discuss dementia and parkinsons, labs done Is Patient Diabetic? No   Primary Care Provider:  Birdie Sons, MD  CC:  discuss dementia and parkinsons and labs done.  History of Present Illness: Neurologic concerns significant confusion, hallucinations, memoryloss Real physical concerns---progressive symptoms: having significant difficulty with ADLs.  He is to the point where his wife hire is a Comptroller to stay with him when she is gone.  He needs help with bathing and dressing.  Many of symptoms seem to have started after adjusting medications.  During the examination patient had periods of confusion, sleepiness.  He did however have periods where he was quite lucid and participated in complex conversations.  All other systems reviewed and were negative   Preventive Screening-Counseling & Management  Alcohol-Tobacco     Smoking Status: never  Current Problems (verified): 1)  B12 Deficiency  (ICD-266.2) 2)  Rectal Bleeding  (ICD-569.3) 3)  Anemia  (ICD-285.9) 4)  Ankle Pain  (ICD-719.47) 5)  Hypotension, Orthostatic, Hx of  (ICD-V12.50) 6)  Hypertension  (ICD-401.9) 7)  Hyperlipidemia  (ICD-272.4) 8)  Parkinson's Disease  (ICD-332.0) 9)  Solar Keratosis  (ICD-702.0) 10)  Osteopenia  (ICD-733.90) 11)  Myocardial Infarction, Hx of  (ICD-412) 12)  Coronary Artery Disease  (ICD-414.00)  Current Medications (verified): 1)  Atenolol 25 Mg  Tabs (Atenolol) .... Take 1 Tablet By Mouth At Bedtime 2)  Lipitor 80 Mg  Tabs (Atorvastatin Calcium) .... Take 1 Tablet By Mouth Once A Day 3)  Aspirin 81 Mg  Tabs (Aspirin) .... Take 1 Tablet By Mouth Once A Day 4)   Plavix 75 Mg  Tabs (Clopidogrel Bisulfate) .... Take 1 Tablet By Mouth Once A Day 5)  Requip 3 Mg Tabs (Ropinirole Hcl) .... Take 1 Tablet By Mouth Three Times A Day--Takes 1mg  Once Daily Until 3/11 6)  Niaspan 500 Mg  Tbcr (Niacin (Antihyperlipidemic)) .... Take 1 Tablet By Mouth At Bedtime 7)  Lisinopril 5 Mg Tabs (Lisinopril) .... Take 1 Tablet By Mouth Once A Day 8)  Sinemet Cr 50-200 Mg Cr-Tabs (Carbidopa-Levodopa) .... Take 1 Tablet By Mouth Three Times A Day 9)  Alprazolam 0.25 Mg Tabs (Alprazolam) .Marland Kitchen.. 1-2 At Bedtime 10)  Allergy Shots .... Weekly 11)  Nascobal 500 Mcg/0.59ml Soln (Cyanocobalamin) .... One Spray, One Nostril, Once A Week 12)  Vitamin C 500 Mg Tabs (Ascorbic Acid) .... Once Daily  Allergies (verified): No Known Drug Allergies  Past History:  Past Medical History: Last updated: 08/10/2008 Coronary artery disease Myocardial infarction, hx of Osteopenia Hyperlipidemia Hypertension Parkinson's disease  Past Surgical History: Last updated: 07/29/2007 Coronary artery bypass graft Bilat Knee replacement Stent x3  Family History: Last updated: 07/29/2007 Family History of Arthritis Family History Hypertension Family History of Cardiovascular disorder  Social History: Last updated: 12/27/2009 Retired-Chemist Married, 2 girls Never Smoked Alcohol use-yes Drug use-no Regular exercise-yes  Risk Factors: Exercise: yes (07/29/2007)  Risk Factors: Smoking Status: never (02/28/2010)  Review of Systems       All other systems reviewed and were negative   Physical Exam  General:  patient is in no acute distress.  HEENT exam atraumatic, normocephalic symmetric her muscles are intact.  Neck is supple without lymphadenopathy, thyromegaly, tender venous distention.  Chest without increased work of breathing.  Cardiac exam S1-S2 with regular rate.  Abdominal exam thin contract best sounds, soft, nontender.  Extremities there is no clubbing cyanosis or edema.   Neurologic exam he is alert at times.  However he also has periods where he doses off.  He has periods where he appears quite engaged and involved and participates in complex conversations.  He has a resting tremor of his upper extremities.  Significant cogwheeling.  Gait is shuffling.   Impression & Recommendations:  Problem # 1:  PARKINSON'S DISEASE (ICD-332.0) it is unclear to me whether his symptoms are related to Parkinson's disease or medications.  I think it is worth suggesting his medications.  See medication list.  We'll try to minimize medications that are not absolutely necessary.  Patient and wife understand this.  Patient's wife was present during the entire office visit.  Problem # 2:  B12 DEFICIENCY (ICD-266.2) continued replacement.  Problem # 3:  HYPERTENSION (ICD-401.9) patient's blood pressure is on the low side.  We'll adjust medications as below. The following medications were removed from the medication list:    Atenolol 25 Mg Tabs (Atenolol) .Marland Kitchen... Take 1 tablet by mouth at bedtime His updated medication list for this problem includes:    Lisinopril 5 Mg Tabs (Lisinopril) .Marland Kitchen... 1 tablet by mouth daily  BP today: 90/52 Prior BP: 80/52 (12/27/2009)  Labs Reviewed: K+: 4.5 (02/23/2008) Creat: : 1.1 (02/23/2008)   Chol: 102 (11/12/2009)   HDL: 40.00 (11/12/2009)   LDL: 47 (11/12/2009)   TG: 76.0 (11/12/2009)  Problem # 4:  CORONARY ARTERY DISEASE (ICD-414.00) no symptoms. The following medications were removed from the medication list:    Atenolol 25 Mg Tabs (Atenolol) .Marland Kitchen... Take 1 tablet by mouth at bedtime His updated medication list for this problem includes:    Aspirin 81 Mg Tabs (Aspirin) .Marland Kitchen... Take 1 tablet by mouth once a day    Plavix 75 Mg Tabs (Clopidogrel bisulfate) .Marland Kitchen... Take 1 tablet by mouth once a day    Lisinopril 5 Mg Tabs (Lisinopril) .Marland Kitchen... 1 tablet by mouth daily I had a long discussion with patient and wife regarding end-of-life issues.  They  will discuss CODE STATUS.  Patient does have a living will and wife is power of attorney.  Complete Medication List: 1)  Aspirin 81 Mg Tabs (Aspirin) .... Take 1 tablet by mouth once a day 2)  Plavix 75 Mg Tabs (Clopidogrel bisulfate) .... Take 1 tablet by mouth once a day 3)  Sinemet Cr 50-200 Mg Cr-tabs (Carbidopa-levodopa) .... Take 1 tablet by mouth three times a day 4)  Alprazolam 0.25 Mg Tabs (Alprazolam) .Marland Kitchen.. 1-2 at bedtime 5)  Nascobal 500 Mcg/0.16ml Soln (Cyanocobalamin) .... One spray, one nostril, once a week 6)  Vitamin C 500 Mg Tabs (Ascorbic acid) .... Once daily 7)  Lisinopril 5 Mg Tabs (Lisinopril) .Marland Kitchen.. 1 tablet by mouth daily  Patient Instructions: 1)  Please schedule a follow-up appointment in 2 weeks. Prescriptions: LISINOPRIL 5 MG TABS (LISINOPRIL) 1 tablet by mouth daily  #90 x 3   Entered and Authorized by:   Birdie Sons MD   Signed by:   Birdie Sons MD on 02/28/2010   Method used:   [facility]  [address]  [phone]* (retail)  [address] [place], [place]  [address]       Ph: [phone] or [phone]       Fax: [phone]   RxID:   [id number]   Appended Document: consult re: ?dementia/parkinsons/cjr call patient or wife,  see how he is doing  Appended Document: consult re: ?dementia/parkinsons/cjr I spoke with the patient's wife and she states that his hallucinations are better to almost gone, his movements are getting more difficult.  He is however sleeping better with the medication but has more incontinence at night.  They have an office visit next Tuesday.

## 2011-01-23 NOTE — Letter (Signed)
Summary: [facility]   Imported By: Maryln Gottron 01/06/2011 14:46:24  _____________________________________________________________________  External Attachment:    Type:   Image     Comment:   External Document

## 2011-03-27 LAB — DIFFERENTIAL
Basophils Absolute: 0 10*3/uL (ref 0.0–0.1)
Eosinophils Absolute: 0.1 10*3/uL (ref 0.0–0.7)
Eosinophils Relative: 2 % (ref 0–5)
Lymphocytes Relative: 12 % (ref 12–46)
Lymphs Abs: 0.6 10*3/uL — ABNORMAL LOW (ref 0.7–4.0)
Monocytes Absolute: 0.6 10*3/uL (ref 0.1–1.0)
Monocytes Relative: 10 % (ref 3–12)

## 2011-03-27 LAB — CBC
MCV: 98.8 fL (ref 78.0–100.0)
Platelets: 158 10*3/uL (ref 150–400)
RBC: 3.56 MIL/uL — ABNORMAL LOW (ref 4.22–5.81)
RBC: 3.68 MIL/uL — ABNORMAL LOW (ref 4.22–5.81)
RDW: 13.6 % (ref 11.5–15.5)
WBC: 4.8 10*3/uL (ref 4.0–10.5)
WBC: 5.5 10*3/uL (ref 4.0–10.5)

## 2011-03-27 LAB — COMPREHENSIVE METABOLIC PANEL
AST: 27 U/L (ref 0–37)
AST: 28 U/L (ref 0–37)
Albumin: 3.6 g/dL (ref 3.5–5.2)
Albumin: 3.6 g/dL (ref 3.5–5.2)
Alkaline Phosphatase: 56 U/L (ref 39–117)
BUN: 19 mg/dL (ref 6–23)
CO2: 25 mEq/L (ref 19–32)
CO2: 25 mEq/L (ref 19–32)
Calcium: 9 mg/dL (ref 8.4–10.5)
Chloride: 103 mEq/L (ref 96–112)
Creatinine, Ser: 1.32 mg/dL (ref 0.4–1.5)
GFR calc Af Amer: >60 mL/min (ref >60)
GFR calc non Af Amer: 53 mL/min — ABNORMAL LOW (ref >60)
Glucose, Bld: 94 mg/dL (ref 70–99)
Potassium: 4.3 mEq/L (ref 3.5–5.1)
Sodium: 133 mEq/L — ABNORMAL LOW (ref 135–145)
Total Protein: 6.2 g/dL (ref 6.0–8.3)
Total Protein: 6.2 g/dL (ref 6.0–8.3)

## 2011-03-27 LAB — GLUCOSE, CAPILLARY
Glucose-Capillary: 151 mg/dL — ABNORMAL HIGH (ref 70–99)
Glucose-Capillary: 91 mg/dL (ref 70–99)

## 2011-03-27 LAB — URINALYSIS, ROUTINE W REFLEX MICROSCOPIC
Glucose, UA: NEGATIVE mg/dL
Ketones, ur: NEGATIVE mg/dL
Nitrite: NEGATIVE
Protein, ur: NEGATIVE mg/dL
Urobilinogen, UA: 0.2 mg/dL (ref 0.0–1.0)

## 2011-03-27 LAB — CARDIAC PANEL(CRET KIN+CKTOT+MB+TROPI)
Relative Index: 2.1 (ref 0.0–2.5)
Total CK: 128 U/L (ref 7–232)

## 2011-03-27 LAB — RPR: RPR Ser Ql: NONREACTIVE

## 2011-03-27 LAB — APTT
aPTT: 28 seconds (ref 24–37)
aPTT: 30 seconds (ref 24–37)

## 2011-03-27 LAB — LIPID PANEL: Cholesterol: 117 mg/dL (ref 0–200)

## 2011-03-27 LAB — HEMOGLOBIN A1C: Mean Plasma Glucose: 123 mg/dL

## 2011-03-27 LAB — VITAMIN B12: Vitamin B-12: 263 pg/mL (ref 211–911)

## 2011-03-27 LAB — PROTIME-INR
INR: 1.06 (ref 0.00–1.49)
Prothrombin Time: 13.7 seconds (ref 11.6–15.2)

## 2011-03-27 LAB — CK TOTAL AND CKMB (NOT AT ARMC): CK, MB: 3.1 ng/mL (ref 0.3–4.0)

## 2011-04-01 ENCOUNTER — Encounter: Payer: Self-pay | Admitting: Internal Medicine

## 2011-04-01 ENCOUNTER — Ambulatory Visit (INDEPENDENT_AMBULATORY_CARE_PROVIDER_SITE_OTHER): Payer: Medicare Other | Admitting: Internal Medicine

## 2011-04-01 VITALS — BP 102/60 | HR 84 | Temp 97.8°F | Wt 137.0 lb

## 2011-04-01 DIAGNOSIS — M25579 Pain in unspecified ankle and joints of unspecified foot: Secondary | ICD-10-CM

## 2011-04-01 NOTE — Progress Notes (Signed)
Subjective:    Patient ID: Keith Francis, male    DOB: [birth year], 77 y.o.   MRN: [id number]  HPI Patient is here with his wife  Patient comes in for followup of evaluation of right ankle pain. He's had this problem for a few years. It seems to be worsening. There is some thought that the ankle pain is disrupting his physical therapy related to Parkinson's disease. He reports the pain is worse with walking and with certain twisting movements. He is unable to recreate this in the office. He has no pain at rest. He has minimal swelling of the ankle. I have reviewed previous notes and the imaging results. Note arthritic changes of the right ankle in 2010.  From a Parkinson's point of view the patient is doing much better. Medications are being monitored at [facility].  Past Medical History  Diagnosis Date  . CAD (coronary artery disease)   . Myocardial infarction   . Osteopenia   . Parkinson disease   . Hyperlipidemia   . Hypertension    Past Surgical History  Procedure Date  . Coronary artery bypass graft   . Total knee arthroplasty   . Carotid stent     x3    reports that he has never smoked. He does not have any smokeless tobacco history on file. He reports that he drinks alcohol. He reports that he does not use illicit drugs. family history includes Arthritis in his mother; Heart disease in his father, mother, and sister; Hypertension in his mother; Kidney disease in his father; and Lung disease in his brother. No Known Allergies  Review of Systems  patient denies chest pain, shortness of breath, orthopnea. Denies lower extremity edema, abdominal pain, change in appetite, change in bowel movements. Patient denies rashes, additional musculoskeletal complaints. No other specific complaints in a complete review of systems.      Objective: patient denies chest pain, shortness of breath, orthopnea. Denies lower extremity edema, abdominal pain, change in appetite, change in bowel  movements. Patient denies rashes, musculoskeletal complaints. No other specific complaints in a complete review of systems.     Physical Exam Elderly male in no acute distress. He has a shuffling, slow gait. He has a resting tremor. Speech is somewhat soft and trembling. He has full range of motion of both ankles. Right ankle is somewhat larger than left ankle. There is no pain to palpation. No erythema. Peripheral pulses in the right foot are normal.     Current Outpatient Prescriptions  Medication Sig Dispense Refill  . Ascorbic Acid (VITAMIN C) 500 MG tablet Take 500 mg by mouth daily.        Marland Kitchen aspirin 81 MG tablet Take 81 mg by mouth daily.        Marland Kitchen atorvastatin (LIPITOR) 80 MG tablet Take 80 mg by mouth daily.        . carbidopa-levodopa (SINEMET) 25-100 MG per tablet Take 1 tablet by mouth 3 (three) times daily.        . clopidogrel (PLAVIX) 75 MG tablet Take 75 mg by mouth daily.        Marland Kitchen lisinopril (PRINIVIL,ZESTRIL) 5 MG tablet Take 5 mg by mouth daily.        . mirtazapine (REMERON SOL-TAB) 15 MG disintegrating tablet Take 15 mg by mouth at bedtime.        . niacin (NIASPAN) 500 MG CR tablet Take 500 mg by mouth at bedtime.        Marland Kitchen NON  FORMULARY Rivastagmine 3mg  1 po bid      . NON FORMULARY Allergy shots weekly          Assessment & Plan:

## 2011-04-01 NOTE — Assessment & Plan Note (Signed)
Patient with chronic ankle pain. I have reviewed imaging results. I discussed the results with patient and his wife. I don't think any further imaging necessary at this time. Given the arthritis known in his right ankle I think it might be best to proceed with injection. I think it would be best if this was done under fluoroscopy.

## 2011-04-03 ENCOUNTER — Other Ambulatory Visit: Payer: Self-pay | Admitting: Internal Medicine

## 2011-04-03 DIAGNOSIS — M25571 Pain in right ankle and joints of right foot: Secondary | ICD-10-CM

## 2011-04-08 ENCOUNTER — Ambulatory Visit
Admission: RE | Admit: 2011-04-08 | Discharge: 2011-04-08 | Disposition: A | Discharge status: Home / Self Care | Payer: Medicare Other | Source: Ambulatory Visit | Attending: Internal Medicine | Admitting: Internal Medicine

## 2011-04-08 ENCOUNTER — Ambulatory Visit: Payer: Self-pay | Admitting: Internal Medicine

## 2011-04-08 DIAGNOSIS — M25571 Pain in right ankle and joints of right foot: Secondary | ICD-10-CM

## 2011-09-30 ENCOUNTER — Encounter: Payer: Self-pay | Admitting: Internal Medicine

## 2011-09-30 ENCOUNTER — Ambulatory Visit (INDEPENDENT_AMBULATORY_CARE_PROVIDER_SITE_OTHER): Payer: Medicare Other | Admitting: Internal Medicine

## 2011-09-30 VITALS — BP 120/70 | Temp 97.5°F | Wt 137.0 lb

## 2011-09-30 DIAGNOSIS — Z23 Encounter for immunization: Secondary | ICD-10-CM

## 2011-09-30 DIAGNOSIS — G20A1 Parkinson's disease without dyskinesia, without mention of fluctuations: Secondary | ICD-10-CM

## 2011-09-30 DIAGNOSIS — I10 Essential (primary) hypertension: Secondary | ICD-10-CM

## 2011-09-30 DIAGNOSIS — G20 Parkinson's disease: Secondary | ICD-10-CM

## 2011-09-30 DIAGNOSIS — Z0000 Encounter for general adult medical examination without abnormal findings: Secondary | ICD-10-CM

## 2011-09-30 DIAGNOSIS — M25579 Pain in unspecified ankle and joints of unspecified foot: Secondary | ICD-10-CM

## 2011-09-30 MED ORDER — TRAMADOL HCL 50 MG PO TABS: 50.0000 mg | ORAL_TABLET | Freq: Four times a day (QID) | ORAL | Status: AC | PRN

## 2011-09-30 NOTE — Progress Notes (Signed)
  Subjective:    Patient ID: Keith Francis, male    DOB: 09/02/1933, 77 y.o.   MRN: [id number]  HPI  77 year old patient who has a history of Parkinson's disease. He has treated hypertension and coronary artery disease. He presents with a chief complaint of worsening right ankle pain. This is most marked laterally this has been a chronic problem and radiographs in 2002 and revealed degenerative changes only. He is requesting orthopedic referral. He has a history of arthritis and status post knee replacement surgery in [place]    Review of Systems  Constitutional: Negative for fever, chills, appetite change and fatigue.  HENT: Negative for hearing loss, ear pain, congestion, sore throat, trouble swallowing, neck stiffness, dental problem, voice change and tinnitus.   Eyes: Negative for pain, discharge and visual disturbance.  Respiratory: Negative for cough, chest tightness, wheezing and stridor.   Cardiovascular: Negative for chest pain, palpitations and leg swelling.  Gastrointestinal: Negative for nausea, vomiting, abdominal pain, diarrhea, constipation, blood in stool and abdominal distention.  Genitourinary: Negative for urgency, hematuria, flank pain, discharge, difficulty urinating and genital sores.  Musculoskeletal: Positive for joint swelling and gait problem. Negative for myalgias, back pain and arthralgias.  Skin: Negative for rash.  Neurological: Positive for weakness. Negative for dizziness, syncope, speech difficulty, numbness and headaches.  Hematological: Negative for adenopathy. Does not bruise/bleed easily.  Psychiatric/Behavioral: Negative for behavioral problems and dysphoric mood. The patient is not nervous/anxious.        Objective:   Physical Exam  Constitutional: He appears well-developed and well-nourished. No distress.       Blood pressure 120/70  Musculoskeletal: He exhibits tenderness.       Slight tenderness and hypertrophic changes involving his right  lateral ankle  Neurological:       Parkinsonian tremor          Assessment & Plan:   Osteoarthritis right ankle with chronic pain Parkinson's disease Hypertension  We'll treat with tramadol for pain. We'll set up for orthopedic referral

## 2011-09-30 NOTE — Patient Instructions (Signed)
Orthopedic followup as scheduled  Call or return to clinic prn if these symptoms worsen or fail to improve as anticipated.  

## 2011-10-30 ENCOUNTER — Ambulatory Visit: Payer: Medicare Other | Attending: Psychiatry | Admitting: Physical Therapy

## 2011-10-30 DIAGNOSIS — R269 Unspecified abnormalities of gait and mobility: Secondary | ICD-10-CM | POA: Insufficient documentation

## 2011-10-30 DIAGNOSIS — IMO0001 Reserved for inherently not codable concepts without codable children: Secondary | ICD-10-CM | POA: Insufficient documentation

## 2011-11-10 ENCOUNTER — Ambulatory Visit: Payer: Medicare Other | Admitting: Physical Therapy

## 2011-11-10 ENCOUNTER — Ambulatory Visit: Payer: No Typology Code available for payment source | Admitting: Physical Therapy

## 2011-11-12 ENCOUNTER — Ambulatory Visit: Payer: Medicare Other | Admitting: Physical Therapy

## 2011-11-17 ENCOUNTER — Ambulatory Visit: Payer: Medicare Other | Admitting: Physical Therapy

## 2011-11-21 ENCOUNTER — Ambulatory Visit: Payer: Medicare Other | Admitting: Physical Therapy

## 2011-11-26 ENCOUNTER — Ambulatory Visit: Payer: Medicare Other | Attending: Psychiatry | Admitting: Physical Therapy

## 2011-11-26 DIAGNOSIS — R269 Unspecified abnormalities of gait and mobility: Secondary | ICD-10-CM | POA: Insufficient documentation

## 2011-11-26 DIAGNOSIS — IMO0001 Reserved for inherently not codable concepts without codable children: Secondary | ICD-10-CM | POA: Insufficient documentation

## 2011-11-28 ENCOUNTER — Ambulatory Visit: Payer: Medicare Other | Admitting: Physical Therapy

## 2011-12-03 ENCOUNTER — Ambulatory Visit: Payer: Medicare Other | Admitting: Physical Therapy

## 2011-12-05 ENCOUNTER — Ambulatory Visit: Payer: Medicare Other | Admitting: Physical Therapy

## 2011-12-25 ENCOUNTER — Ambulatory Visit: Payer: Medicare Other | Attending: Psychiatry | Admitting: Physical Therapy

## 2011-12-25 DIAGNOSIS — IMO0001 Reserved for inherently not codable concepts without codable children: Secondary | ICD-10-CM | POA: Insufficient documentation

## 2011-12-25 DIAGNOSIS — R269 Unspecified abnormalities of gait and mobility: Secondary | ICD-10-CM | POA: Insufficient documentation

## 2011-12-29 ENCOUNTER — Ambulatory Visit: Payer: No Typology Code available for payment source | Admitting: Physical Therapy

## 2011-12-31 ENCOUNTER — Ambulatory Visit: Payer: Medicare Other | Admitting: Physical Therapy

## 2012-01-01 ENCOUNTER — Ambulatory Visit: Payer: Medicare Other | Admitting: Physical Therapy

## 2012-01-05 ENCOUNTER — Ambulatory Visit: Payer: No Typology Code available for payment source | Admitting: Physical Therapy

## 2012-01-06 ENCOUNTER — Ambulatory Visit: Payer: Medicare Other | Admitting: Physical Therapy

## 2012-01-08 ENCOUNTER — Ambulatory Visit: Payer: No Typology Code available for payment source | Admitting: Physical Therapy

## 2012-01-08 ENCOUNTER — Ambulatory Visit: Payer: Medicare Other | Admitting: Physical Therapy

## 2012-01-12 ENCOUNTER — Ambulatory Visit: Payer: Medicare Other | Admitting: Physical Therapy

## 2012-01-14 ENCOUNTER — Ambulatory Visit: Payer: Medicare Other | Admitting: Physical Therapy

## 2012-01-19 ENCOUNTER — Ambulatory Visit: Payer: Medicare Other | Admitting: Physical Therapy

## 2012-01-21 ENCOUNTER — Ambulatory Visit: Payer: No Typology Code available for payment source | Admitting: Physical Therapy

## 2012-02-13 ENCOUNTER — Encounter: Payer: Self-pay | Admitting: Internal Medicine

## 2012-09-14 ENCOUNTER — Ambulatory Visit (INDEPENDENT_AMBULATORY_CARE_PROVIDER_SITE_OTHER): Payer: Medicare Other

## 2012-09-14 DIAGNOSIS — Z23 Encounter for immunization: Secondary | ICD-10-CM

## 2012-11-15 ENCOUNTER — Ambulatory Visit (INDEPENDENT_AMBULATORY_CARE_PROVIDER_SITE_OTHER): Payer: Medicare Other | Admitting: Internal Medicine

## 2012-11-15 ENCOUNTER — Encounter: Payer: Self-pay | Admitting: Internal Medicine

## 2012-11-15 VITALS — BP 136/74 | Temp 98.1°F | Wt 138.0 lb

## 2012-11-15 DIAGNOSIS — I2510 Atherosclerotic heart disease of native coronary artery without angina pectoris: Secondary | ICD-10-CM

## 2012-11-15 DIAGNOSIS — E785 Hyperlipidemia, unspecified: Secondary | ICD-10-CM

## 2012-11-15 DIAGNOSIS — M25579 Pain in unspecified ankle and joints of unspecified foot: Secondary | ICD-10-CM

## 2012-11-15 DIAGNOSIS — G20 Parkinson's disease: Secondary | ICD-10-CM

## 2012-11-15 DIAGNOSIS — E538 Deficiency of other specified B group vitamins: Secondary | ICD-10-CM

## 2012-11-15 DIAGNOSIS — I10 Essential (primary) hypertension: Secondary | ICD-10-CM

## 2012-11-15 LAB — LIPID PANEL
HDL: 55.6 mg/dL (ref >39.00)
Total CHOL/HDL Ratio: 2
Triglycerides: 50 mg/dL (ref 0.0–149.0)
VLDL: 10 mg/dL (ref 0.0–40.0)

## 2012-11-15 LAB — VITAMIN B12: Vitamin B-12: 360 pg/mL (ref 211–911)

## 2012-11-15 LAB — CBC WITH DIFFERENTIAL/PLATELET
Basophils Absolute: 0 10*3/uL (ref 0.0–0.1)
Eosinophils Relative: 2.3 % (ref 0.0–5.0)
HCT: 37.1 % — ABNORMAL LOW (ref 39.0–52.0)
Lymphs Abs: 1.5 10*3/uL (ref 0.7–4.0)
MCV: 98.3 fl (ref 78.0–100.0)
Monocytes Absolute: 0.7 10*3/uL (ref 0.1–1.0)
Monocytes Relative: 8.9 % (ref 3.0–12.0)
Neutrophils Relative %: 69.6 % (ref 43.0–77.0)
Platelets: 175 10*3/uL (ref 150.0–400.0)
RDW: 13.7 % (ref 11.5–14.6)
WBC: 7.7 10*3/uL (ref 4.5–10.5)

## 2012-11-15 LAB — BASIC METABOLIC PANEL
Calcium: 8.8 mg/dL (ref 8.4–10.5)
GFR: 76.93 mL/min (ref >60.00)
Potassium: 3.9 mEq/L (ref 3.5–5.1)
Sodium: 139 mEq/L (ref 135–145)

## 2012-11-15 LAB — HEPATIC FUNCTION PANEL
AST: 24 U/L (ref 0–37)
Albumin: 3.8 g/dL (ref 3.5–5.2)
Alkaline Phosphatase: 60 U/L (ref 39–117)
Total Bilirubin: 0.6 mg/dL (ref 0.3–1.2)

## 2012-11-15 NOTE — Progress Notes (Signed)
Patient ID: Keith Francis, male   DOB: 1933/11/21, 78 y.o.   MRN: [id number] parkinsons-- doing ok- followed by nerology-- meds adjusted  CAD-- off plavix- fal risk outweights potential benefits  Fall ris-- wife asking about wheel chair  Ankle Pain-- has seen ortho-- wearing brace and pt and wife thinks may contribute to fall risk  Past Medical History  Diagnosis Date  . CAD (coronary artery disease)   . Myocardial infarction   . Osteopenia   . Parkinson disease   . Hyperlipidemia   . Hypertension     History   Social History  . Marital Status: Married    Spouse Name: N/A    Number of Children: N/A  . Years of Education: N/A   Occupational History  . Not on file.   Social History Main Topics  . Smoking status: Never Smoker   . Smokeless tobacco: Never Used  . Alcohol Use: Yes  . Drug Use: No  . Sexually Active: Not on file   Other Topics Concern  . Not on file   Social History Narrative  . No narrative on file    Past Surgical History  Procedure Date  . Coronary artery bypass graft   . Total knee arthroplasty   . Carotid stent     x3    Family History  Problem Relation Age of Onset  . Heart disease Mother   . Hypertension Mother   . Arthritis Mother   . Heart disease Father   . Kidney disease Father     renal failure  . Heart disease Sister   . Lung disease Brother     No Known Allergies  Current Outpatient Prescriptions on File Prior to Visit  Medication Sig Dispense Refill  . Ascorbic Acid (VITAMIN C) 500 MG tablet Take 500 mg by mouth daily.        Marland Kitchen aspirin 81 MG tablet Take 81 mg by mouth daily.        Marland Kitchen atorvastatin (LIPITOR) 80 MG tablet Take 80 mg by mouth daily.        . carbidopa-levodopa (SINEMET CR) 50-200 MG per tablet Take 1 tablet by mouth 4 (four) times daily.        Marland Kitchen lisinopril (PRINIVIL,ZESTRIL) 5 MG tablet Take 5 mg by mouth daily.        . mirtazapine (REMERON SOL-TAB) 15 MG disintegrating tablet Take 15 mg by mouth at  bedtime.        . niacin (NIASPAN) 500 MG CR tablet Take 500 mg by mouth at bedtime.        . NON FORMULARY Allergy shots weekly       . rivastigmine (EXELON) 3 MG capsule Take 1 tablet by mouth Three times a day.         patient denies chest pain, shortness of breath, orthopnea. Denies lower extremity edema, abdominal pain, change in appetite, change in bowel movements. Patient denies rashes, musculoskeletal complaints. No other specific complaints in a complete review of systems.   BP 136/74  Temp 98.1 F (36.7 C) (Oral)  Wt 138 lb (62.596 kg) elderly male in no acute distress. HEENT exam atraumatic, normocephalic, neck supple without jugular venous distention. Chest clear to auscultation cardiac exam S1-S2 are regular. Abdominal exam overweight with bowel sounds, soft and nontender. Extremities no edema. Neurologic exam: resting tremor, shuffling gait, masked facies, mumbled speech, oriented and able to carry on a normal conversation

## 2012-11-17 NOTE — Assessment & Plan Note (Signed)
Has regular f/u with neurology 

## 2012-11-17 NOTE — Assessment & Plan Note (Signed)
No sx's 

## 2012-11-17 NOTE — Assessment & Plan Note (Signed)
BP: 136/74 mmHg  Controlled Note risk of fluctuating bp due to parkinsons

## 2012-11-17 NOTE — Assessment & Plan Note (Signed)
Controlled on no medications. 

## 2012-11-30 ENCOUNTER — Telehealth: Payer: Self-pay | Admitting: Internal Medicine

## 2012-11-30 DIAGNOSIS — G20 Parkinson's disease: Secondary | ICD-10-CM

## 2012-11-30 NOTE — Telephone Encounter (Signed)
[facility] said pt is not a canidate for home health due to pt is not home bound. Pt is participating in parkinson disease class at [facility]. Leta Jungling said pt may benefit from occupation/speech/physical therapy at [facility].

## 2012-12-01 NOTE — Telephone Encounter (Signed)
Refer to outpatient rehab for evaluation for wheelchair

## 2012-12-01 NOTE — Telephone Encounter (Signed)
Referral order placed.

## 2012-12-29 ENCOUNTER — Other Ambulatory Visit: Payer: Self-pay | Admitting: Internal Medicine

## 2012-12-29 DIAGNOSIS — G20 Parkinson's disease: Secondary | ICD-10-CM

## 2013-01-10 ENCOUNTER — Ambulatory Visit: Payer: Medicare Other | Admitting: Occupational Therapy

## 2013-01-18 ENCOUNTER — Ambulatory Visit: Payer: Medicare Other | Attending: Internal Medicine | Admitting: Occupational Therapy

## 2013-01-18 ENCOUNTER — Other Ambulatory Visit: Payer: Self-pay | Admitting: Internal Medicine

## 2013-01-18 DIAGNOSIS — M2420 Disorder of ligament, unspecified site: Secondary | ICD-10-CM | POA: Insufficient documentation

## 2013-01-18 DIAGNOSIS — G20 Parkinson's disease: Secondary | ICD-10-CM

## 2013-01-18 DIAGNOSIS — M629 Disorder of muscle, unspecified: Secondary | ICD-10-CM | POA: Insufficient documentation

## 2013-01-18 DIAGNOSIS — R5381 Other malaise: Secondary | ICD-10-CM | POA: Insufficient documentation

## 2013-01-18 DIAGNOSIS — IMO0001 Reserved for inherently not codable concepts without codable children: Secondary | ICD-10-CM | POA: Insufficient documentation

## 2013-01-18 DIAGNOSIS — R279 Unspecified lack of coordination: Secondary | ICD-10-CM | POA: Insufficient documentation

## 2013-01-18 DIAGNOSIS — R4189 Other symptoms and signs involving cognitive functions and awareness: Secondary | ICD-10-CM | POA: Insufficient documentation

## 2013-01-31 ENCOUNTER — Ambulatory Visit: Payer: Medicare Other | Attending: Internal Medicine | Admitting: Physical Therapy

## 2013-01-31 DIAGNOSIS — IMO0001 Reserved for inherently not codable concepts without codable children: Secondary | ICD-10-CM | POA: Insufficient documentation

## 2013-01-31 DIAGNOSIS — R4189 Other symptoms and signs involving cognitive functions and awareness: Secondary | ICD-10-CM | POA: Insufficient documentation

## 2013-01-31 DIAGNOSIS — M629 Disorder of muscle, unspecified: Secondary | ICD-10-CM | POA: Insufficient documentation

## 2013-01-31 DIAGNOSIS — R5381 Other malaise: Secondary | ICD-10-CM | POA: Insufficient documentation

## 2013-01-31 DIAGNOSIS — R279 Unspecified lack of coordination: Secondary | ICD-10-CM | POA: Insufficient documentation

## 2013-01-31 DIAGNOSIS — M2420 Disorder of ligament, unspecified site: Secondary | ICD-10-CM | POA: Insufficient documentation

## 2013-02-07 ENCOUNTER — Ambulatory Visit: Payer: Medicare Other | Admitting: Physical Therapy

## 2013-02-09 ENCOUNTER — Ambulatory Visit: Payer: Medicare Other | Admitting: Physical Therapy

## 2013-02-09 ENCOUNTER — Ambulatory Visit: Payer: Medicare Other | Admitting: Occupational Therapy

## 2013-02-11 ENCOUNTER — Ambulatory Visit: Payer: Medicare Other | Admitting: Occupational Therapy

## 2013-02-11 ENCOUNTER — Ambulatory Visit: Payer: Medicare Other | Admitting: Physical Therapy

## 2013-02-14 ENCOUNTER — Ambulatory Visit: Payer: Medicare Other | Admitting: Physical Therapy

## 2013-02-14 ENCOUNTER — Ambulatory Visit: Payer: Medicare Other | Admitting: Occupational Therapy

## 2013-02-15 ENCOUNTER — Emergency Department (HOSPITAL_COMMUNITY)
Admission: EM | Admit: 2013-02-15 | Discharge: 2013-02-16 | Disposition: A | Discharge status: Home / Self Care | Payer: Medicare Other | Attending: Emergency Medicine | Admitting: Emergency Medicine

## 2013-02-15 ENCOUNTER — Emergency Department (HOSPITAL_COMMUNITY): Payer: Medicare Other

## 2013-02-15 ENCOUNTER — Encounter (HOSPITAL_COMMUNITY): Payer: Self-pay | Admitting: *Deleted

## 2013-02-15 DIAGNOSIS — I252 Old myocardial infarction: Secondary | ICD-10-CM | POA: Insufficient documentation

## 2013-02-15 DIAGNOSIS — Z9861 Coronary angioplasty status: Secondary | ICD-10-CM | POA: Insufficient documentation

## 2013-02-15 DIAGNOSIS — Y92009 Unspecified place in unspecified non-institutional (private) residence as the place of occurrence of the external cause: Secondary | ICD-10-CM | POA: Insufficient documentation

## 2013-02-15 DIAGNOSIS — I10 Essential (primary) hypertension: Secondary | ICD-10-CM | POA: Insufficient documentation

## 2013-02-15 DIAGNOSIS — IMO0002 Reserved for concepts with insufficient information to code with codable children: Secondary | ICD-10-CM | POA: Insufficient documentation

## 2013-02-15 DIAGNOSIS — Z951 Presence of aortocoronary bypass graft: Secondary | ICD-10-CM | POA: Insufficient documentation

## 2013-02-15 DIAGNOSIS — Z7982 Long term (current) use of aspirin: Secondary | ICD-10-CM | POA: Insufficient documentation

## 2013-02-15 DIAGNOSIS — I2510 Atherosclerotic heart disease of native coronary artery without angina pectoris: Secondary | ICD-10-CM | POA: Insufficient documentation

## 2013-02-15 DIAGNOSIS — Y9389 Activity, other specified: Secondary | ICD-10-CM | POA: Insufficient documentation

## 2013-02-15 DIAGNOSIS — Z79899 Other long term (current) drug therapy: Secondary | ICD-10-CM | POA: Insufficient documentation

## 2013-02-15 DIAGNOSIS — W010XXA Fall on same level from slipping, tripping and stumbling without subsequent striking against object, initial encounter: Secondary | ICD-10-CM | POA: Insufficient documentation

## 2013-02-15 DIAGNOSIS — E785 Hyperlipidemia, unspecified: Secondary | ICD-10-CM | POA: Insufficient documentation

## 2013-02-15 DIAGNOSIS — S0180XA Unspecified open wound of other part of head, initial encounter: Secondary | ICD-10-CM | POA: Insufficient documentation

## 2013-02-15 DIAGNOSIS — G20A1 Parkinson's disease without dyskinesia, without mention of fluctuations: Secondary | ICD-10-CM | POA: Insufficient documentation

## 2013-02-15 DIAGNOSIS — M899 Disorder of bone, unspecified: Secondary | ICD-10-CM | POA: Insufficient documentation

## 2013-02-15 NOTE — ED Notes (Signed)
Pt fell on tile floor and hit his head, denies LOC,  Pt is very shaky due to Parkinson's disease,

## 2013-02-15 NOTE — ED Provider Notes (Signed)
History     CSN: [id number]  Arrival date & time 02/15/13  2248   First MD Initiated Contact with Patient 02/15/13 2309      Chief Complaint  Patient presents with  . Fall  . Facial Laceration    (Consider location/radiation/quality/duration/timing/severity/associated sxs/prior treatment) HPI Hx per PT and caregiver and daughter bedside - at home wearing socks and slipped in the bathroom, he struck his head and sustained laceration over L eye.  Bleeding controlled PTA.   No LOC, no neck pain, also sustained abrasion/ skin tear R elbow.  He denies any other pain or trauma.  Able to ambulate after event. Mod in severity Past Medical History  Diagnosis Date  . CAD (coronary artery disease)   . Myocardial infarction   . Osteopenia   . Parkinson disease   . Hyperlipidemia   . Hypertension     Past Surgical History  Procedure Laterality Date  . Coronary artery bypass graft    . Total knee arthroplasty    . Carotid stent      x3    Family History  Problem Relation Age of Onset  . Heart disease Mother   . Hypertension Mother   . Arthritis Mother   . Heart disease Father   . Kidney disease Father     renal failure  . Heart disease Sister   . Lung disease Brother     History  Substance Use Topics  . Smoking status: Never Smoker   . Smokeless tobacco: Never Used  . Alcohol Use: Yes      Review of Systems  Constitutional: Negative for fever and chills.  HENT: Negative for neck pain and neck stiffness.   Eyes: Negative for pain.  Respiratory: Negative for shortness of breath.   Cardiovascular: Negative for chest pain.  Gastrointestinal: Negative for abdominal pain.  Genitourinary: Negative for dysuria.  Musculoskeletal: Negative for back pain.  Skin: Positive for wound. Negative for rash.  Neurological: Negative for headaches.  All other systems reviewed and are negative.    Allergies  Review of patient's allergies indicates no known allergies.  Home  Medications   Current Outpatient Rx  Name  Route  Sig  Dispense  Refill  . Ascorbic Acid (VITAMIN C) 500 MG tablet   Oral   Take 500 mg by mouth daily.           Marland Kitchen aspirin 81 MG tablet   Oral   Take 81 mg by mouth daily.           Marland Kitchen atorvastatin (LIPITOR) 80 MG tablet   Oral   Take 80 mg by mouth at bedtime.          . carbidopa-levodopa (SINEMET CR) 50-200 MG per tablet   Oral   Take 1 tablet by mouth 4 (four) times daily.           Marland Kitchen lisinopril (PRINIVIL,ZESTRIL) 5 MG tablet   Oral   Take 5 mg by mouth daily.           . niacin (NIASPAN) 500 MG CR tablet   Oral   Take 500 mg by mouth at bedtime.           . rivastigmine (EXELON) 3 MG capsule   Oral   Take 3 mg by mouth 3 (three) times daily with meals.         . NON FORMULARY      Allergy shots weekly  BP 147/72  Pulse 85  Temp(Src) 98.7 F (37.1 C) (Oral)  Resp 20  SpO2 100%  Physical Exam  Constitutional: He is oriented to person, place, and time. He appears well-developed and well-nourished.  HENT:  Head: Normocephalic.  2cm linear full thickness lac lateral to left eye. Bleeding controlled, mild tenderness no obvious bony deformity  Eyes: EOM are normal. Pupils are equal, round, and reactive to light.  Neck: Normal range of motion. Neck supple.  No c spine tenderness or deformity  Cardiovascular: Normal rate, regular rhythm and intact distal pulses.   Pulmonary/Chest: Effort normal and breath sounds normal. No respiratory distress. He exhibits no tenderness.  Abdominal: Soft. There is no tenderness.  Musculoskeletal: Normal range of motion. He exhibits no edema and no tenderness.  Skin tear R elbow, FROM no bony deformity, MAE x 4 with distal N/V intact  Neurological: He is alert and oriented to person, place, and time.  Skin: Skin is warm and dry.    ED Course  LACERATION REPAIR Date/Time: 02/16/2013 1:13 AM Performed by: Sunnie Nielsen Authorized by: Sunnie Nielsen Consent: Verbal consent obtained. Risks and benefits: risks, benefits and alternatives were discussed Consent given by: patient Patient understanding: patient states understanding of the procedure being performed Patient consent: the patient's understanding of the procedure matches consent given Procedure consent: procedure consent matches procedure scheduled Required items: required blood products, implants, devices, and special equipment available Patient identity confirmed: verbally with patient Time out: Immediately prior to procedure a "time out" was called to verify the correct patient, procedure, equipment, support staff and site/side marked as required. Body area: head/neck Location details: left eyebrow Laceration length: 2 cm Tendon involvement: none Nerve involvement: none Vascular damage: no Anesthesia: local infiltration Local anesthetic: lidocaine 1% without epinephrine Anesthetic total: 2 ml Preparation: Patient was prepped and draped in the usual sterile fashion. Irrigation solution: saline Irrigation method: syringe Amount of cleaning: standard Skin closure: 6-0 nylon Number of sutures: 2 Technique: simple Approximation: close Approximation difficulty: simple Dressing: antibiotic ointment and 4x4 sterile gauze Patient tolerance: Patient tolerated the procedure well with no immediate complications.   (including critical care time)  Ct Head Wo Contrast  02/16/2013  *RADIOLOGY REPORT*  Clinical Data: Headache following head injury and fall. .  CT HEAD WITHOUT CONTRAST  Technique:  Contiguous axial images were obtained from the base of the skull through the vertex without contrast.  Comparison: 10/14/2009 CT  Findings: Atrophy and chronic small vessel white matter ischemic changes again noted.  No acute intracranial abnormalities are identified, including mass lesion or mass effect, hydrocephalus, extra-axial fluid collection, midline shift, hemorrhage, or acute  infarction.  The visualized bony calvarium is unremarkable.  IMPRESSION: No evidence of acute intracranial abnormality.  Atrophy and chronic small vessel white matter ischemic changes.   Original Report Authenticated By: Harmon Pier, M.D.     Date: 02/15/2013  Rate: 98  Rhythm: normal sinus rhythm  QRS Axis: normal  Intervals: PR prolonged  ST/T Wave abnormalities: nonspecific ST changes  Conduction Disutrbances:first-degree A-V block   Narrative Interpretation:   Old EKG Reviewed: none available  Tetanus UTD. Tylenol PO.  MDM  Fall, scalp lac and elbow skin tear  CT brain obtained/ reviewed  Lac repair and wound care as above  Infection and fall precautions provided        Sunnie Nielsen, MD 02/16/13 [phone]

## 2013-02-16 ENCOUNTER — Ambulatory Visit: Payer: Medicare Other | Admitting: Occupational Therapy

## 2013-02-16 ENCOUNTER — Ambulatory Visit: Payer: Medicare Other | Admitting: Physical Therapy

## 2013-02-16 MED ORDER — ACETAMINOPHEN 325 MG PO TABS: 650.0000 mg | ORAL_TABLET | Freq: Once | ORAL | Status: DC

## 2013-02-19 DIAGNOSIS — I959 Hypotension, unspecified: Secondary | ICD-10-CM

## 2013-02-19 HISTORY — DX: Hypotension, unspecified: I95.9

## 2013-02-21 ENCOUNTER — Ambulatory Visit: Payer: Medicare Other | Admitting: Physical Therapy

## 2013-02-21 ENCOUNTER — Ambulatory Visit: Payer: Medicare Other | Admitting: Occupational Therapy

## 2013-02-23 ENCOUNTER — Ambulatory Visit: Payer: Medicare Other | Admitting: Occupational Therapy

## 2013-02-23 ENCOUNTER — Ambulatory Visit: Payer: Medicare Other | Attending: Internal Medicine | Admitting: Physical Therapy

## 2013-02-23 DIAGNOSIS — M2420 Disorder of ligament, unspecified site: Secondary | ICD-10-CM | POA: Insufficient documentation

## 2013-02-23 DIAGNOSIS — R4189 Other symptoms and signs involving cognitive functions and awareness: Secondary | ICD-10-CM | POA: Insufficient documentation

## 2013-02-23 DIAGNOSIS — R279 Unspecified lack of coordination: Secondary | ICD-10-CM | POA: Insufficient documentation

## 2013-02-23 DIAGNOSIS — M629 Disorder of muscle, unspecified: Secondary | ICD-10-CM | POA: Insufficient documentation

## 2013-02-23 DIAGNOSIS — R5381 Other malaise: Secondary | ICD-10-CM | POA: Insufficient documentation

## 2013-02-23 DIAGNOSIS — IMO0001 Reserved for inherently not codable concepts without codable children: Secondary | ICD-10-CM | POA: Insufficient documentation

## 2013-02-28 ENCOUNTER — Ambulatory Visit: Payer: Medicare Other | Admitting: Physical Therapy

## 2013-02-28 ENCOUNTER — Ambulatory Visit: Payer: Medicare Other | Admitting: Occupational Therapy

## 2013-03-02 ENCOUNTER — Ambulatory Visit: Payer: Medicare Other | Admitting: Occupational Therapy

## 2013-03-02 ENCOUNTER — Ambulatory Visit: Payer: Medicare Other | Admitting: Physical Therapy

## 2013-03-07 ENCOUNTER — Ambulatory Visit: Payer: Medicare Other | Admitting: Occupational Therapy

## 2013-03-07 ENCOUNTER — Ambulatory Visit: Payer: Medicare Other | Admitting: Physical Therapy

## 2013-03-09 ENCOUNTER — Ambulatory Visit: Payer: Medicare Other | Admitting: Physical Therapy

## 2013-03-09 ENCOUNTER — Ambulatory Visit: Payer: Medicare Other | Admitting: Occupational Therapy

## 2013-03-18 ENCOUNTER — Ambulatory Visit: Payer: Medicare Other | Admitting: Internal Medicine

## 2013-03-22 ENCOUNTER — Telehealth: Payer: Self-pay | Admitting: Internal Medicine

## 2013-03-22 NOTE — Telephone Encounter (Signed)
Pt and wife are coming in the am for eval for assisted living and paperwork. Wife wants to assure paperwork is ready. Paperwork dropped off last week. Pls advise.

## 2013-03-22 NOTE — Telephone Encounter (Signed)
Form is ready for p/u

## 2013-03-23 ENCOUNTER — Ambulatory Visit: Payer: Medicare Other | Admitting: Occupational Therapy

## 2013-03-23 ENCOUNTER — Encounter: Payer: Self-pay | Admitting: Internal Medicine

## 2013-03-23 ENCOUNTER — Ambulatory Visit: Payer: Medicare Other | Admitting: Physical Therapy

## 2013-03-23 ENCOUNTER — Ambulatory Visit: Payer: Medicare Other | Admitting: Internal Medicine

## 2013-03-23 ENCOUNTER — Ambulatory Visit (INDEPENDENT_AMBULATORY_CARE_PROVIDER_SITE_OTHER): Payer: Medicare Other | Admitting: Internal Medicine

## 2013-03-23 VITALS — BP 114/64 | HR 80 | Temp 97.9°F | Wt 135.0 lb

## 2013-03-23 DIAGNOSIS — G20 Parkinson's disease: Secondary | ICD-10-CM

## 2013-03-23 DIAGNOSIS — Z111 Encounter for screening for respiratory tuberculosis: Secondary | ICD-10-CM

## 2013-03-23 NOTE — Progress Notes (Signed)
Patient ID: Keith Francis, male   DOB: [birth year], 79 y.o.   MRN: [id number] parkinsons-  Moving to assisted living Patient is unsafe with walking- He has scooter. Wife states he needs a manual wheelchair-- has been unsafe on scooter Also would benefit from hospital bed (maybe) May benefit from lift chair.   Needs meds administered at the right time.   Ankle pain-- braces help.   Reviewed pmh, psh, meds  Past Medical History  Diagnosis Date  . CAD (coronary artery disease)   . Myocardial infarction   . Osteopenia   . Parkinson disease   . Hyperlipidemia   . Hypertension     History   Social History  . Marital Status: Married    Spouse Name: N/A    Number of Children: N/A  . Years of Education: N/A   Occupational History  . Not on file.   Social History Main Topics  . Smoking status: Never Smoker   . Smokeless tobacco: Never Used  . Alcohol Use: Yes  . Drug Use: No  . Sexually Active: Not on file   Other Topics Concern  . Not on file   Social History Narrative  . No narrative on file    Past Surgical History  Procedure Laterality Date  . Coronary artery bypass graft    . Total knee arthroplasty    . Carotid stent      x3    Family History  Problem Relation Age of Onset  . Heart disease Mother   . Hypertension Mother   . Arthritis Mother   . Heart disease Father   . Kidney disease Father     renal failure  . Heart disease Sister   . Lung disease Brother     No Known Allergies  Current Outpatient Prescriptions on File Prior to Visit  Medication Sig Dispense Refill  . Ascorbic Acid (VITAMIN C) 500 MG tablet Take 500 mg by mouth daily.        Marland Kitchen aspirin 81 MG tablet Take 81 mg by mouth daily.        Marland Kitchen atorvastatin (LIPITOR) 80 MG tablet Take 80 mg by mouth at bedtime.       . carbidopa-levodopa (SINEMET CR) 50-200 MG per tablet Take 1 tablet by mouth 4 (four) times daily.        . niacin (NIASPAN) 500 MG CR tablet Take 500 mg by mouth at  bedtime.        . rivastigmine (EXELON) 3 MG capsule Take 3 mg by mouth 3 (three) times daily with meals.       No current facility-administered medications on file prior to visit.     patient denies chest pain, shortness of breath, orthopnea. Denies lower extremity edema, abdominal pain, change in appetite, change in bowel movements. Patient denies rashes, musculoskeletal complaints. No other specific complaints in a complete review of systems.   BP 114/64  Pulse 80  Temp(Src) 97.9 F (36.6 C) (Oral)  Wt 135 lb (61.236 kg)  BMI 21.14 kg/m2  elderly male in no acute distress. HEENT exam atraumatic, normocephalic, neck supple without jugular venous distention. Chest clear to auscultation cardiac exam S1-S2 are regular. Abdominal exam overweight with bowel sounds, soft and nontender. Extremities no edema. Neurologic exam is alert with a shuffling gait, resting tremor, cogwheeling present

## 2013-03-24 NOTE — Assessment & Plan Note (Signed)
Discussed meds and discussed pending assisted living arrangement compleed paper work for assisted living Continue meds  Total time 25 minutes - > 1/2 time counseling

## 2013-03-25 ENCOUNTER — Telehealth: Payer: Self-pay | Admitting: Internal Medicine

## 2013-03-25 NOTE — Telephone Encounter (Signed)
Changed and refax

## 2013-03-25 NOTE — Telephone Encounter (Signed)
One more thing: order for bed needs to state: "semi electric hospital bed".

## 2013-03-25 NOTE — Telephone Encounter (Signed)
Medical equip company sent release and req last OV note - which I faxed them. They also need an order from Dr. Cato Mulligan for a hospital bed. Please include dx code.  May fax to: [facility], ATTN: Dorathy Kinsman (fax#: [phone])  They wanted it today, I told them it wouldn't be able to be done until at least Mon a.m.

## 2013-03-28 ENCOUNTER — Ambulatory Visit: Payer: Medicare Other | Admitting: Occupational Therapy

## 2013-03-28 ENCOUNTER — Ambulatory Visit: Payer: Medicare Other | Admitting: Physical Therapy

## 2013-03-29 ENCOUNTER — Telehealth: Payer: Self-pay | Admitting: Internal Medicine

## 2013-03-29 NOTE — Telephone Encounter (Signed)
Ann from [facility] call and requesting a call back from [place].

## 2013-03-30 NOTE — Telephone Encounter (Signed)
Left message for pt to call back  °

## 2013-03-31 NOTE — Telephone Encounter (Signed)
They need to get in touch with pt to discuss lift chair and need pts phone number. Gave them number

## 2013-04-07 ENCOUNTER — Telehealth: Payer: Self-pay | Admitting: Internal Medicine

## 2013-04-07 NOTE — Telephone Encounter (Signed)
We sent rx for wheelchair for this pt to [facility]. Something on the rx is not right, so Medicare would not pay. [facility] is faxing over the form. In order for Medicare to pay, there is something at the bottom of the form that needs to be marked/checked. FYI. Please complete form when it comes and send back to [facility].

## 2013-04-11 NOTE — Telephone Encounter (Signed)
Form filled out and faxed 

## 2013-08-29 ENCOUNTER — Ambulatory Visit (INDEPENDENT_AMBULATORY_CARE_PROVIDER_SITE_OTHER): Payer: Medicare Other | Admitting: Cardiology

## 2013-08-29 ENCOUNTER — Encounter: Payer: Self-pay | Admitting: Cardiology

## 2013-08-29 VITALS — BP 118/72 | HR 84 | Ht 67.5 in | Wt 131.0 lb

## 2013-08-29 DIAGNOSIS — I2510 Atherosclerotic heart disease of native coronary artery without angina pectoris: Secondary | ICD-10-CM

## 2013-08-29 DIAGNOSIS — G20 Parkinson's disease: Secondary | ICD-10-CM

## 2013-08-29 DIAGNOSIS — E785 Hyperlipidemia, unspecified: Secondary | ICD-10-CM | POA: Insufficient documentation

## 2013-08-29 NOTE — Assessment & Plan Note (Signed)
This is his greatest problem. Poor memory, falls, pt has difficulty with balance.

## 2013-08-29 NOTE — Assessment & Plan Note (Signed)
Last Labs I have are from Nov. 2013.  LDL was 69 and HDL 55.  His wife would like to stop the niacin.  I think that is fine.  He is not eating well and we will check lipids now but ok to stop niacin.

## 2013-08-29 NOTE — Assessment & Plan Note (Signed)
Hx CABG in [place], stent to native Rt wth DES 2004 hx cardiogenic shock, IABP, myoview 2013 negative ischemia.  Echo 2010 EF 50-55%, aortic sclerosis, mild TR. Occ. Brief shooting lt. Chest discomfort does not occur frequently.  His wife was not aware of any pain.  EKG stable, if any changes is symptoms pt will notify his wife and we will proceed with stress test at that time.

## 2013-08-29 NOTE — Patient Instructions (Signed)
Have blood work done for lipids in next 2 days. Nothing to eat after midnight.   Ok to stop niacin.  Follow up with Dr. Allyson Sabal in 6 months.  For constipation:  Over the counter, colace a stool softener miralax -fiber with laxative  Pro biotics may help but try the above first, they would be cheaper.

## 2013-08-29 NOTE — Progress Notes (Signed)
08/29/2013   PCP: Judie Petit, MD   Chief Complaint  Patient presents with  . Follow-up    routine 6 months; a lot of Parkinson issues  . Chest Pain    "quick" pain on left side of chest    Primary Cardiologist: Dr. 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  He has a history of CAD with CABG in 1996 X 4 in [place]  In 2004 pt had NSTEMI and found to have occl VG-RCA, occl. VG-LCX, patent VG-DIAG, distal LIMA MOD DISEASE  EF 45%.  He then had DES stent placed to prox, mid and distal RCA.  This was complicated by dissection of RCA, cardiogenic shock and IABP.  Pt recovered and has done well.  Last Myoview was 2013 which was negative for ischemia last echocardiogram was 2010 with EF 50-55% a time.  Today he admits to mild sharp shooting chest pain in the left anterior chest, not associated with shortness of breath, nausea or diaphoresis. This does not occur that often. His wife has no knowledge of this discomfort.  His memory is poor with a Parkinson's and he finds it difficult to say how frequent they occur.  He is having more Parkinson's issues. He also has balance issues and wearing a splint on his right ankle.   No Known Allergies  Current Outpatient Prescriptions  Medication Sig Dispense Refill  . Ascorbic Acid (VITAMIN C) 500 MG tablet Take 500 mg by mouth daily.        Marland Kitchen aspirin 81 MG tablet Take 81 mg by mouth daily.        Marland Kitchen atorvastatin (LIPITOR) 80 MG tablet Take 80 mg by mouth at bedtime.       . carbidopa-levodopa (SINEMET CR) 50-200 MG per tablet Take 1 tablet by mouth 4 (four) times daily.        . rivastigmine (EXELON) 3 MG capsule Take 3 mg by mouth 3 (three) times daily with meals.       No current facility-administered medications for this visit.    Past Medical History  Diagnosis Date  . CAD (coronary artery disease)     hx CABG in [place]-1996, stent to native Rt wth DES 2004 hx cardiogenic shock, IABP, myoview 2013  negative ischemia  . Myocardial infarction 2004  . Osteopenia   . Parkinson disease   . Hyperlipidemia   . Hypertension   . 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 Hypotension 02/2013    lisinopril stopped. + falls    Past Surgical History  Procedure Laterality Date  . Coronary artery bypass graft  1996    X 4  . Total knee arthroplasty      bil  . Carotid stent      x3  . Cardiac catheterization  06/20/03    occl VG-RCA, occl. VG-LCX, patent CG-DIAG, distal LIMA MOD DISEASE  EF 45%  . Coronary angioplasty with stent placement  07/31/03    DES TO prox, mid and distal RCA    OZH:YQMVHQI:ON colds or fevers, mild decrease in weight changes Skin:no rashes or ulcers HEENT:no blurred vision, no congestion CV:see HPI PUL:see HPI GI:no diarrhea, + constipation no melena, no indigestion GU:no hematuria, no dysuria MS:+ rt ankle pain joint pain, no claudication Neuro:no syncope, no lightheadedness Endo:no diabetes, no thyroid disease  PHYSICAL EXAM BP 118/72  Pulse 84  Ht 5' 7.5" (1.715 m)  Wt 131 lb (  59.421 kg)  BMI 20.2 kg/m2 General:Pleasant affect but flat, no change in facial expression, NAD Skin:Warm and dry, brisk capillary refill HEENT:normocephalic, sclera clear, mucus membranes moist Neck:supple, no JVD, no bruits  Heart:S1S2 RRR without murmur, gallup, rub or click Lungs:clear without rales, rhonchi, or wheezes VWU:JWJX, non tender, + BS, do not palpate liver spleen or masses Ext:no lower ext edema,  Neuro:alert and oriented, MAE, follows commands   EKG:SR normal EKG, no acute changes from previous EKGs  ASSESSMENT AND PLAN CAD (coronary artery disease) Hx CABG in [place], stent to native Rt wth DES 2004 hx cardiogenic shock, IABP, myoview 2013 negative ischemia.  Echo 2010 EF 50-55%, aortic sclerosis, mild TR. Occ. Brief shooting lt. Chest discomfort does not occur frequently.  His wife was not aware of any pain.  EKG stable, if any changes is symptoms  pt will notify his wife and we will proceed with stress test at that time.    Hyperlipidemia Last Labs I have are from Nov. 2013.  LDL was 69 and HDL 55.  His wife would like to stop the niacin.  I think that is fine.  He is not eating well and we will check lipids now but ok to stop niacin.  PARKINSON'S DISEASE This is his greatest problem. Poor memory, falls, pt has difficulty with balance.  He does have constipation issues I recommended Colace stool softener of encounter, MiraLax if needed and probiotics with the okay with his current medications.  He'll follow with Dr. Allyson Sabal in 6 months.

## 2013-08-31 ENCOUNTER — Other Ambulatory Visit: Payer: Self-pay | Admitting: Cardiovascular Disease

## 2013-08-31 NOTE — Telephone Encounter (Signed)
Rx was sent to pharmacy electronically. 

## 2013-09-01 LAB — HEPATIC FUNCTION PANEL
AST: 18 U/L (ref 0–37)
Bilirubin, Direct: 0.1 mg/dL (ref 0.0–0.3)
Indirect Bilirubin: 0.5 mg/dL (ref 0.0–0.9)
Total Bilirubin: 0.6 mg/dL (ref 0.3–1.2)

## 2013-09-01 LAB — LIPID PANEL: Total CHOL/HDL Ratio: 2.5 Ratio

## 2013-09-05 ENCOUNTER — Encounter: Payer: Self-pay | Admitting: *Deleted

## 2013-09-14 ENCOUNTER — Ambulatory Visit (INDEPENDENT_AMBULATORY_CARE_PROVIDER_SITE_OTHER): Payer: Medicare Other

## 2013-09-14 DIAGNOSIS — Z23 Encounter for immunization: Secondary | ICD-10-CM

## 2013-10-18 ENCOUNTER — Encounter: Payer: Self-pay | Admitting: Cardiovascular Disease

## 2014-01-10 ENCOUNTER — Telehealth: Payer: Self-pay | Admitting: Internal Medicine

## 2014-01-10 ENCOUNTER — Encounter (HOSPITAL_COMMUNITY): Payer: Self-pay | Admitting: Emergency Medicine

## 2014-01-10 ENCOUNTER — Emergency Department (HOSPITAL_COMMUNITY)
Admission: EM | Admit: 2014-01-10 | Discharge: 2014-01-10 | Disposition: A | Discharge status: Home / Self Care | Payer: Medicare Other | Attending: Emergency Medicine | Admitting: Emergency Medicine

## 2014-01-10 ENCOUNTER — Emergency Department (HOSPITAL_COMMUNITY): Payer: Medicare Other

## 2014-01-10 DIAGNOSIS — R5383 Other fatigue: Principal | ICD-10-CM

## 2014-01-10 DIAGNOSIS — B349 Viral infection, unspecified: Secondary | ICD-10-CM

## 2014-01-10 DIAGNOSIS — Z7982 Long term (current) use of aspirin: Secondary | ICD-10-CM | POA: Insufficient documentation

## 2014-01-10 DIAGNOSIS — G20A1 Parkinson's disease without dyskinesia, without mention of fluctuations: Secondary | ICD-10-CM | POA: Insufficient documentation

## 2014-01-10 DIAGNOSIS — G20 Parkinson's disease: Secondary | ICD-10-CM | POA: Insufficient documentation

## 2014-01-10 DIAGNOSIS — Z9861 Coronary angioplasty status: Secondary | ICD-10-CM | POA: Insufficient documentation

## 2014-01-10 DIAGNOSIS — I2510 Atherosclerotic heart disease of native coronary artery without angina pectoris: Secondary | ICD-10-CM | POA: Insufficient documentation

## 2014-01-10 DIAGNOSIS — E785 Hyperlipidemia, unspecified: Secondary | ICD-10-CM | POA: Insufficient documentation

## 2014-01-10 DIAGNOSIS — I10 Essential (primary) hypertension: Secondary | ICD-10-CM | POA: Insufficient documentation

## 2014-01-10 DIAGNOSIS — I252 Old myocardial infarction: Secondary | ICD-10-CM | POA: Insufficient documentation

## 2014-01-10 DIAGNOSIS — Z951 Presence of aortocoronary bypass graft: Secondary | ICD-10-CM | POA: Insufficient documentation

## 2014-01-10 DIAGNOSIS — Z79899 Other long term (current) drug therapy: Secondary | ICD-10-CM | POA: Insufficient documentation

## 2014-01-10 DIAGNOSIS — R531 Weakness: Secondary | ICD-10-CM

## 2014-01-10 DIAGNOSIS — B9789 Other viral agents as the cause of diseases classified elsewhere: Secondary | ICD-10-CM | POA: Insufficient documentation

## 2014-01-10 DIAGNOSIS — R5381 Other malaise: Secondary | ICD-10-CM | POA: Insufficient documentation

## 2014-01-10 DIAGNOSIS — Z8739 Personal history of other diseases of the musculoskeletal system and connective tissue: Secondary | ICD-10-CM | POA: Insufficient documentation

## 2014-01-10 LAB — COMPREHENSIVE METABOLIC PANEL
ALBUMIN: 3.6 g/dL (ref 3.5–5.2)
ALK PHOS: 65 U/L (ref 39–117)
AST: 20 U/L (ref 0–37)
BILIRUBIN TOTAL: 0.4 mg/dL (ref 0.3–1.2)
BUN: 18 mg/dL (ref 6–23)
CHLORIDE: 100 meq/L (ref 96–112)
CO2: 23 mEq/L (ref 19–32)
Calcium: 8.7 mg/dL (ref 8.4–10.5)
Creatinine, Ser: 0.8 mg/dL (ref 0.50–1.35)
GFR calc Af Amer: >90 mL/min (ref >90)
GFR calc non Af Amer: 83 mL/min — ABNORMAL LOW (ref >90)
Glucose, Bld: 78 mg/dL (ref 70–99)
POTASSIUM: 4.3 meq/L (ref 3.7–5.3)
SODIUM: 137 meq/L (ref 137–147)
TOTAL PROTEIN: 6.7 g/dL (ref 6.0–8.3)

## 2014-01-10 LAB — CBC WITH DIFFERENTIAL/PLATELET
BASOS PCT: 1 % (ref 0–1)
Basophils Absolute: 0 10*3/uL (ref 0.0–0.1)
EOS ABS: 0 10*3/uL (ref 0.0–0.7)
Eosinophils Relative: 1 % (ref 0–5)
HCT: 38.1 % — ABNORMAL LOW (ref 39.0–52.0)
HEMOGLOBIN: 12.7 g/dL — AB (ref 13.0–17.0)
Lymphocytes Relative: 28 % (ref 12–46)
Lymphs Abs: 0.9 10*3/uL (ref 0.7–4.0)
MCH: 32.2 pg (ref 26.0–34.0)
MCHC: 33.3 g/dL (ref 30.0–36.0)
MCV: 96.5 fL (ref 78.0–100.0)
MONOS PCT: 8 % (ref 3–12)
Monocytes Absolute: 0.3 10*3/uL (ref 0.1–1.0)
NEUTROS ABS: 2.1 10*3/uL (ref 1.7–7.7)
NEUTROS PCT: 62 % (ref 43–77)
PLATELETS: 134 10*3/uL — AB (ref 150–400)
RBC: 3.95 MIL/uL — AB (ref 4.22–5.81)
RDW: 13.1 % (ref 11.5–15.5)
WBC: 3.3 10*3/uL — ABNORMAL LOW (ref 4.0–10.5)

## 2014-01-10 LAB — TROPONIN I

## 2014-01-10 LAB — URINALYSIS, ROUTINE W REFLEX MICROSCOPIC
BILIRUBIN URINE: NEGATIVE
GLUCOSE, UA: NEGATIVE mg/dL
HGB URINE DIPSTICK: NEGATIVE
KETONES UR: NEGATIVE mg/dL
LEUKOCYTES UA: NEGATIVE
Nitrite: NEGATIVE
PH: 5 (ref 5.0–8.0)
PROTEIN: NEGATIVE mg/dL
Specific Gravity, Urine: 1.025 (ref 1.005–1.030)
Urobilinogen, UA: 0.2 mg/dL (ref 0.0–1.0)

## 2014-01-10 LAB — CG4 I-STAT (LACTIC ACID): LACTIC ACID, VENOUS: 1.31 mmol/L (ref 0.5–2.2)

## 2014-01-10 MED ORDER — SODIUM CHLORIDE 0.9 % IV SOLN
INTRAVENOUS | Status: DC
  Administered 2014-01-10: 125 mL/h via INTRAVENOUS

## 2014-01-10 MED ORDER — OSELTAMIVIR PHOSPHATE 75 MG PO CAPS: 75.0000 mg | ORAL_CAPSULE | Freq: Two times a day (BID) | ORAL | Status: DC

## 2014-01-10 MED ORDER — ACETAMINOPHEN 325 MG PO TABS
650.0000 mg | ORAL_TABLET | Freq: Once | ORAL | Status: AC
  Administered 2014-01-10: 650 mg via ORAL
  Filled 2014-01-10: qty 2

## 2014-01-10 NOTE — ED Notes (Signed)
[facility] Expected date:  Expected time:  Means of arrival:  Comments: EMS-weak

## 2014-01-10 NOTE — ED Provider Notes (Signed)
CSN: [id number]     Arrival date & time 01/10/14  1228 History   First MD Initiated Contact with Patient 01/10/14 1237     Chief Complaint  Patient presents with  . Weakness   (Consider location/radiation/quality/duration/timing/severity/associated sxs/prior Treatment) Patient is a 80 y.o. male presenting with weakness. The history is provided by the patient. The history is limited by the condition of the patient.  Weakness   patient here with weakness x2-3 days. Patient has a history of Parkinson's disorder. He is a very poor historian. Denies any headache or chest pain or shortness of breath. Denies any abdominal pain. Notes whole-body weakness which is made better or worse with nothing. No treatment used prior to arrival. No recent medication started. Denies any urinary symptoms. No cough or congestion. No further history obtainable  Past Medical History  Diagnosis Date  . CAD (coronary artery disease)     [place], stent to native Rt wth DES 2004 hx cardiogenic shock, IABP, myoview 2013 negative ischemia  . Myocardial infarction 2004  . Osteopenia   . Parkinson disease   . Hyperlipidemia   . Hypertension   . 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 Hypotension 02/2013    lisinopril stopped. + falls   Past Surgical History  Procedure Laterality Date  . Coronary artery bypass graft  1996    X 4  . Total knee arthroplasty      bil  . Carotid stent      x3  . Cardiac catheterization  06/20/03    occl VG-RCA, occl. VG-LCX, patent VG-DIAG, distal LIMA MOD DISEASE  EF 45%  . Coronary angioplasty with stent placement  07/31/03    DES TO prox, mid and distal RCA   Family History  Problem Relation Age of Onset  . Heart disease Mother   . Hypertension Mother   . Arthritis Mother   . Heart disease Father   . Kidney disease Father     renal failure  . Heart disease Sister   . Lung disease Brother    History  Substance Use Topics  . Smoking status: Never  Smoker   . Smokeless tobacco: Never Used  . Alcohol Use: Yes    Review of Systems  Unable to perform ROS Neurological: Positive for weakness.    Allergies  Review of patient's allergies indicates no known allergies.  Home Medications   Current Outpatient Rx  Name  Route  Sig  Dispense  Refill  . Ascorbic Acid (VITAMIN C) 500 MG tablet   Oral   Take 500 mg by mouth daily.           Marland Kitchen. aspirin 81 MG tablet   Oral   Take 81 mg by mouth daily.           Marland Kitchen. atorvastatin (LIPITOR) 80 MG tablet      TAKE 1 TABLET BY MOUTH EVERY DAY   90 tablet   3   . carbidopa-levodopa (SINEMET CR) 50-200 MG per tablet   Oral   Take 1 tablet by mouth 4 (four) times daily.           . rivastigmine (EXELON) 3 MG capsule   Oral   Take 3 mg by mouth 3 (three) times daily with meals.          BP 147/74  Pulse 74  Temp(Src) 99.1 F (37.3 C) (Oral)  Resp 17  SpO2 97% Physical Exam  Nursing note and vitals  reviewed. Constitutional: He is oriented to person, place, and time. He appears well-developed and well-nourished.  Non-toxic appearance. No distress.  HENT:  Head: Normocephalic and atraumatic.  Eyes: Conjunctivae, EOM and lids are normal. Pupils are equal, round, and reactive to light.  Neck: Normal range of motion. Neck supple. No tracheal deviation present. No mass present.  Cardiovascular: Normal rate, regular rhythm and normal heart sounds.  Exam reveals no gallop.   No murmur heard. Pulmonary/Chest: Effort normal and breath sounds normal. No stridor. No respiratory distress. He has no decreased breath sounds. He has no wheezes. He has no rhonchi. He has no rales.  Abdominal: Soft. Normal appearance and bowel sounds are normal. He exhibits no distension. There is no tenderness. There is no rebound and no CVA tenderness.  Musculoskeletal: Normal range of motion. He exhibits no edema and no tenderness.  Neurological: He is alert and oriented to person, place, and time. He has  normal strength. No cranial nerve deficit or sensory deficit. GCS eye subscore is 4. GCS verbal subscore is 5. GCS motor subscore is 6.  Skin: Skin is warm and dry. No abrasion and no rash noted.  Psychiatric: His affect is blunt. His speech is delayed. He is slowed.    ED Course  Procedures (including critical care time) Labs Review Labs Reviewed  URINE CULTURE  CBC WITH DIFFERENTIAL  COMPREHENSIVE METABOLIC PANEL  URINALYSIS, ROUTINE W REFLEX MICROSCOPIC  TROPONIN I   Imaging Review No results found.  EKG Interpretation    Date/Time:  Tuesday January 10 2014 13:12:53 EST Ventricular Rate:  80 PR Interval:  188 QRS Duration: 86 QT Interval:  388 QTC Calculation: 447 R Axis:   68 Text Interpretation:  Normal sinus rhythm Normal ECG Confirmed by Glynna Failla  MD, Kainen Struckman ([id number]) on 01/10/2014 1:42:46 PM            MDM  No diagnosis found. Patient with negative workup here and possible influenza according to the daughter who states that the patient is currently at his baseline. Use with probable viral illness and will place on Tamiflu.    Toy Baker, MD 01/10/14 [phone]

## 2014-01-10 NOTE — Telephone Encounter (Signed)
Patient Information:  Caller Name: Waynetta SandyBeth  Phone: [phone]  Patient: Keith Francis, Keith Francis  Gender: Male  DOB: September 01, 1933  Age: 80 Years  PCP: Birdie SonsSwords, Bruce (Adults only)  Office Follow Up:  Does the office need to follow up with this patient?: No  Instructions For The Office: N/A   Symptoms  Reason For Call & Symptoms: Pt's care giver states pt is confused.  Reviewed Health History In EMR: Yes  Reviewed Medications In EMR: Yes  Reviewed Allergies In EMR: Yes  Reviewed Surgeries / Procedures: Yes  Date of Onset of Symptoms: 01/10/2014  Guideline(s) Used:  Weakness (Generalized) and Fatigue  Disposition Per Guideline:   Call EMS 911 Now  Reason For Disposition Reached:   Difficult to awaken or acting confused (e.g., disoriented, slurred speech)  Advice Given:  N/A  Patient Will Follow Care Advice:  YES

## 2014-01-10 NOTE — Discharge Instructions (Signed)

## 2014-01-10 NOTE — ED Notes (Signed)
Per EMS pt comes from home c/o weakness over the past couple days and wants to be evaluated. Pt has Parkinson's.  Pt is A&Ox4 per EMS. Vitals 130/78, 86HR and regular, RR16, 96% O2 on RA, CBG 93. Pt denies pain.

## 2014-01-10 NOTE — Telephone Encounter (Signed)
Pt is in the ER  °

## 2014-01-11 LAB — URINE CULTURE
CULTURE: NO GROWTH
Colony Count: NO GROWTH

## 2014-05-22 ENCOUNTER — Telehealth: Payer: Self-pay | Admitting: Internal Medicine

## 2014-05-22 NOTE — Telephone Encounter (Signed)
Ok per Dr Swords 

## 2014-05-22 NOTE — Telephone Encounter (Signed)
Pt would like to know if dr. Kirtland Bouchard will accept him as a new pt, once dr. Cato Mulligan has left the practice.

## 2014-05-22 NOTE — Telephone Encounter (Signed)
ok 

## 2014-05-24 ENCOUNTER — Encounter: Payer: Medicare Other | Admitting: Internal Medicine

## 2014-08-07 ENCOUNTER — Telehealth: Payer: Self-pay | Admitting: Internal Medicine

## 2014-08-07 NOTE — Telephone Encounter (Signed)
Pt has paperwork for [facility]  And pt's wife has appt tomorrow. Would like to know if that is available for pu at her appt? pls advise.

## 2014-08-08 NOTE — Telephone Encounter (Signed)
Paperwork filled out and given to pt's wife.

## 2014-08-09 ENCOUNTER — Ambulatory Visit (INDEPENDENT_AMBULATORY_CARE_PROVIDER_SITE_OTHER): Payer: Medicare Other | Admitting: Internal Medicine

## 2014-08-09 ENCOUNTER — Encounter: Payer: Self-pay | Admitting: Internal Medicine

## 2014-08-09 VITALS — BP 102/60 | HR 76 | Temp 98.1°F | Resp 18 | Ht 66.0 in | Wt 131.0 lb

## 2014-08-09 DIAGNOSIS — Z8679 Personal history of other diseases of the circulatory system: Secondary | ICD-10-CM

## 2014-08-09 DIAGNOSIS — G20 Parkinson's disease: Secondary | ICD-10-CM

## 2014-08-09 DIAGNOSIS — M25579 Pain in unspecified ankle and joints of unspecified foot: Secondary | ICD-10-CM

## 2014-08-09 DIAGNOSIS — E785 Hyperlipidemia, unspecified: Secondary | ICD-10-CM

## 2014-08-09 DIAGNOSIS — Z0000 Encounter for general adult medical examination without abnormal findings: Secondary | ICD-10-CM

## 2014-08-09 DIAGNOSIS — Z111 Encounter for screening for respiratory tuberculosis: Secondary | ICD-10-CM

## 2014-08-09 DIAGNOSIS — I252 Old myocardial infarction: Secondary | ICD-10-CM

## 2014-08-09 DIAGNOSIS — M25571 Pain in right ankle and joints of right foot: Secondary | ICD-10-CM

## 2014-08-09 DIAGNOSIS — Z23 Encounter for immunization: Secondary | ICD-10-CM

## 2014-08-09 NOTE — Progress Notes (Signed)
Pre visit review using our clinic review tool, if applicable. No additional management support is needed unless otherwise documented below in the visit note. 

## 2014-08-09 NOTE — Progress Notes (Signed)
Subjective:    Patient ID: Keith MouldsRobert Youse, male    DOB: Apr 30, 1933, 80 y.o.   MRN: [id number]  HPI  80 year old patient who is seen today as a new patient.  He is a former patient of Dr. Cato Mulliganswords and has a history of coronary artery disease and prior MI.  He is followed at least annually by cardiology.  He has Parkinson's disease with dementia and history of orthostatic hypotension.  He does have a prior history of hypertension.  Remains on atorvastatin 84.  Dyslipidemia. He has an unstable gait due to parkinsonism and also a chronic right ankle pain. He is also followed [facility]  No new concerns or complaints  Past Medical History  Diagnosis Date  . CAD (coronary artery disease)     [place], stent to native Rt wth DES 2004 hx cardiogenic shock, IABP, myoview 2013 negative ischemia  . Myocardial infarction 2004  . Osteopenia   . Parkinson disease   . Hyperlipidemia   . Hypertension   . 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 Hypotension 02/2013    lisinopril stopped. + falls    History   Social History  . Marital Status: Married    Spouse Name: N/A    Number of Children: N/A  . Years of Education: N/A   Occupational History  . Not on file.   Social History Main Topics  . Smoking status: Never Smoker   . Smokeless tobacco: Never Used  . Alcohol Use: Yes  . Drug Use: No  . Sexual Activity: Not on file   Other Topics Concern  . Not on file   Social History Narrative  . No narrative on file    Past Surgical History  Procedure Laterality Date  . Coronary artery bypass graft  1996    X 4  . Total knee arthroplasty      bil  . Carotid stent      x3  . Cardiac catheterization  06/20/03    occl VG-RCA, occl. VG-LCX, patent VG-DIAG, distal LIMA MOD DISEASE  EF 45%  . Coronary angioplasty with stent placement  07/31/03    DES TO prox, mid and distal RCA    Family History  Problem Relation Age of Onset  . Heart disease  Mother   . Hypertension Mother   . Arthritis Mother   . Heart disease Father   . Kidney disease Father     renal failure  . Heart disease Sister   . Lung disease Brother     No Known Allergies  Current Outpatient Prescriptions on File Prior to Visit  Medication Sig Dispense Refill  . Ascorbic Acid (VITAMIN C) 500 MG tablet Take 500 mg by mouth daily.        Marland Kitchen. aspirin 81 MG tablet Take 81 mg by mouth daily.        Marland Kitchen. atorvastatin (LIPITOR) 80 MG tablet Take 80 mg by mouth daily.      . carbidopa-levodopa (SINEMET CR) 50-200 MG per tablet Take 1 tablet by mouth 4 (four) times daily.      . QUEtiapine (SEROQUEL) 25 MG tablet Take 50 mg by mouth at bedtime.       . rivastigmine (EXELON) 3 MG capsule Take 3 mg by mouth 3 (three) times daily with meals.       No current facility-administered medications on file prior to visit.    BP 102/60  Pulse 76  Temp(Src) 98.1 F (36.7 C) (Oral)  Resp 18  Ht 5\' 6"  (1.676 m)  Wt 131 lb (59.421 kg)  BMI 21.15 kg/m2  SpO2 95%   1. Risk factors, based on past  M,S,F history- patient has known coronary artery disease.  Cardiac risk factors include a history of hypertension, as well as dyslipidemia  2.  Physical activities: Some mobility with a 4. walker.  Limited do to parkinsonism  3.  Depression/mood: No history of depression or mood disorder, but moderate dementia  4.  Hearing: No deficits  5.  ADL's: Requires assistance in all aspects of daily living  6.  Fall risk: High.  As a walker, as well as a scooter and wheelchair  7.  Home safety: No issues identified  8.  Height weight, and visual acuity; height weight, stable.  No change in visual acuity  9.  Counseling: Continue heart healthy diet  10. Lab orders based on risk factors: His laboratory studies reviewed  11. Referral : Not appropriate at this time  12. Care plan: Continue heart healthy diet and aggressive risk factor modification  13. Cognitive assessment: Moderate  dementia  14. Screening: Not appropriate at this time  15. Provider List Update: Followup cardiology neurology.     Review of Systems  Constitutional: Negative for fever, chills, appetite change and fatigue.  HENT: Negative for congestion, dental problem, ear pain, hearing loss, sore throat, tinnitus, trouble swallowing and voice change.   Eyes: Negative for pain, discharge and visual disturbance.  Respiratory: Negative for cough, chest tightness, wheezing and stridor.   Cardiovascular: Negative for chest pain, palpitations and leg swelling.  Gastrointestinal: Negative for nausea, vomiting, abdominal pain, diarrhea, constipation, blood in stool and abdominal distention.  Genitourinary: Negative for urgency, hematuria, flank pain, discharge, difficulty urinating and genital sores.  Musculoskeletal: Positive for arthralgias and gait problem. Negative for back pain, joint swelling, myalgias and neck stiffness.  Skin: Negative for rash.  Neurological: Negative for dizziness, syncope, speech difficulty, weakness, numbness and headaches.  Hematological: Negative for adenopathy. Does not bruise/bleed easily.  Psychiatric/Behavioral: Positive for behavioral problems, confusion and decreased concentration. Negative for dysphoric mood. The patient is not nervous/anxious.        Objective:   Physical Exam  Constitutional: He is oriented to person, place, and time. He appears well-developed.  HENT:  Head: Normocephalic.  Right Ear: External ear normal.  Left Ear: External ear normal.  Eyes: Conjunctivae and EOM are normal.  Neck: Normal range of motion.  Cardiovascular: Normal rate and normal heart sounds.   Pulmonary/Chest: Breath sounds normal.  Abdominal: Bowel sounds are normal.  Musculoskeletal: Normal range of motion. He exhibits no edema and no tenderness.  Right ankle in a brace  Neurological: He is alert and oriented to person, place, and time.  Parkinsonian faces  Psychiatric:  He has a normal mood and affect. His behavior is normal.          Assessment & Plan:   Preventive health exam Parkinson's disease History of orthostatic hypotension Coronary artery disease.  Continue daily aspirin and statin therapy, as well as heart healthy diet Dyslipidemia.  Continue statin therapy  Followup cardiology and neurology  Recheck in 4 months

## 2014-08-09 NOTE — Patient Instructions (Signed)
Health Maintenance A healthy lifestyle and preventative care can promote health and wellness.  Maintain regular health, dental, and eye exams.  Eat a healthy diet. Foods like vegetables, fruits, whole grains, low-fat dairy products, and lean protein foods contain the nutrients you need and are low in calories. Decrease your intake of foods high in solid fats, added sugars, and salt. Get information about a proper diet from your health care provider, if necessary.  Regular physical exercise is one of the most important things you can do for your health. Most adults should get at least 150 minutes of moderate-intensity exercise (any activity that increases your heart rate and causes you to sweat) each week. In addition, most adults need muscle-strengthening exercises on 2 or more days a week.   Maintain a healthy weight. The body mass index (BMI) is a screening tool to identify possible weight problems. It provides an estimate of body fat based on height and weight. Your health care provider can find your BMI and can help you achieve or maintain a healthy weight. For males 20 years and older:  A BMI below 18.5 is considered underweight.  A BMI of 18.5 to 24.9 is normal.  A BMI of 25 to 29.9 is considered overweight.  A BMI of 30 and above is considered obese.  Maintain normal blood lipids and cholesterol by exercising and minimizing your intake of saturated fat. Eat a balanced diet with plenty of fruits and vegetables. Blood tests for lipids and cholesterol should begin at age 20 and be repeated every 5 years. If your lipid or cholesterol levels are high, you are over age 50, or you are at high risk for heart disease, you may need your cholesterol levels checked more frequently.Ongoing high lipid and cholesterol levels should be treated with medicines if diet and exercise are not working.  If you smoke, find out from your health care provider how to quit. If you do not use tobacco, do not  start.  Lung cancer screening is recommended for adults aged 55-80 years who are at high risk for developing lung cancer because of a history of smoking. A yearly low-dose CT scan of the lungs is recommended for people who have at least a 30-pack-year history of smoking and are current smokers or have quit within the past 15 years. A pack year of smoking is smoking an average of 1 pack of cigarettes a day for 1 year (for example, a 30-pack-year history of smoking could mean smoking 1 pack a day for 30 years or 2 packs a day for 15 years). Yearly screening should continue until the smoker has stopped smoking for at least 15 years. Yearly screening should be stopped for people who develop a health problem that would prevent them from having lung cancer treatment.  If you choose to drink alcohol, do not have more than 2 drinks per day. One drink is considered to be 12 oz (360 mL) of beer, 5 oz (150 mL) of wine, or 1.5 oz (45 mL) of liquor.  Avoid the use of street drugs. Do not share needles with anyone. Ask for help if you need support or instructions about stopping the use of drugs.  High blood pressure causes heart disease and increases the risk of stroke. Blood pressure should be checked at least every 1-2 years. Ongoing high blood pressure should be treated with medicines if weight loss and exercise are not effective.  If you are 45-79 years old, ask your health care provider if   you should take aspirin to prevent heart disease.  Diabetes screening involves taking a blood sample to check your fasting blood sugar level. This should be done once every 3 years after age 45 if you are at a normal weight and without risk factors for diabetes. Testing should be considered at a younger age or be carried out more frequently if you are overweight and have at least 1 risk factor for diabetes.  Colorectal cancer can be detected and often prevented. Most routine colorectal cancer screening begins at the age of 50  and continues through age 75. However, your health care provider may recommend screening at an earlier age if you have risk factors for colon cancer. On a yearly basis, your health care provider may provide home test kits to check for hidden blood in the stool. A small camera at the end of a tube may be used to directly examine the colon (sigmoidoscopy or colonoscopy) to detect the earliest forms of colorectal cancer. Talk to your health care provider about this at age 50 when routine screening begins. A direct exam of the colon should be repeated every 5-10 years through age 75, unless early forms of precancerous polyps or small growths are found.  People who are at an increased risk for hepatitis B should be screened for this virus. You are considered at high risk for hepatitis B if:  You were born in a country where hepatitis B occurs often. Talk with your health care provider about which countries are considered high risk.  Your parents were born in a high-risk country and you have not received a shot to protect against hepatitis B (hepatitis B vaccine).  You have HIV or AIDS.  You use needles to inject street drugs.  You live with, or have sex with, someone who has hepatitis B.  You are a man who has sex with other men (MSM).  You get hemodialysis treatment.  You take certain medicines for conditions like cancer, organ transplantation, and autoimmune conditions.  Hepatitis C blood testing is recommended for all people born from 1945 through 1965 and any individual with known risk factors for hepatitis C.  Healthy men should no longer receive prostate-specific antigen (PSA) blood tests as part of routine cancer screening. Talk to your health care provider about prostate cancer screening.  Testicular cancer screening is not recommended for adolescents or adult males who have no symptoms. Screening includes self-exam, a health care provider exam, and other screening tests. Consult with your  health care provider about any symptoms you have or any concerns you have about testicular cancer.  Practice safe sex. Use condoms and avoid high-risk sexual practices to reduce the spread of sexually transmitted infections (STIs).  You should be screened for STIs, including gonorrhea and chlamydia if:  You are sexually active and are younger than 24 years.  You are older than 24 years, and your health care provider tells you that you are at risk for this type of infection.  Your sexual activity has changed since you were last screened, and you are at an increased risk for chlamydia or gonorrhea. Ask your health care provider if you are at risk.  If you are at risk of being infected with HIV, it is recommended that you take a prescription medicine daily to prevent HIV infection. This is called pre-exposure prophylaxis (PrEP). You are considered at risk if:  You are a man who has sex with other men (MSM).  You are a heterosexual man who   is sexually active with multiple partners.  You take drugs by injection.  You are sexually active with a partner who has HIV.  Talk with your health care provider about whether you are at high risk of being infected with HIV. If you choose to begin PrEP, you should first be tested for HIV. You should then be tested every 3 months for as long as you are taking PrEP.  Use sunscreen. Apply sunscreen liberally and repeatedly throughout the day. You should seek shade when your shadow is shorter than you. Protect yourself by wearing long sleeves, pants, a wide-brimmed hat, and sunglasses year round whenever you are outdoors.  Tell your health care provider of new moles or changes in moles, especially if there is a change in shape or color. Also, tell your health care provider if a mole is larger than the size of a pencil eraser.  A one-time screening for abdominal aortic aneurysm (AAA) and surgical repair of large AAAs by ultrasound is recommended for men aged  65-75 years who are current or former smokers.  Stay current with your vaccines (immunizations). Document Released: 06/05/2008 Document Revised: 12/13/2013 Document Reviewed: 05/05/2011 Exit[employer] Patient Information 2015 [employer]. This information is not intended to replace advice given to you by your health care provider. Make sure you discuss any questions you have with your health care provider. Cardiac Diet This diet can help prevent heart disease and stroke. Many factors influence your heart health, including eating and exercise habits. Coronary risk rises a lot with abnormal blood fat (lipid) levels. Cardiac meal planning includes limiting unhealthy fats, increasing healthy fats, and making other small dietary changes. General guidelines are as follows:  Adjust calorie intake to reach and maintain desirable body weight.  Limit total fat intake to less than 30% of total calories. Saturated fat should be less than 7% of calories.  Saturated fats are found in animal products and in some vegetable products. Saturated vegetable fats are found in coconut oil, cocoa butter, palm oil, and palm kernel oil. Read labels carefully to avoid these products as much as possible. Use butter in moderation. Choose tub margarines and oils that have 2 grams of fat or less. Good cooking oils are canola and olive oils.  Practice low-fat cooking techniques. Do not fry food. Instead, broil, bake, boil, steam, grill, roast on a rack, stir-fry, or microwave it. Other fat reducing suggestions include:  Remove the skin from poultry.  Remove all visible fat from meats.  Skim the fat off stews, soups, and gravies before serving them.  Steam vegetables in water or broth instead of sauting them in fat.  Avoid foods with trans fat (or hydrogenated oils), such as commercially fried foods and commercially baked goods. Commercial shortening and deep-frying fats will contain trans fat.  Increase intake of fruits,  vegetables, whole grains, and legumes to replace foods high in fat.  Increase consumption of nuts, legumes, and seeds to at least 4 servings weekly. One serving of a legume equals  cup, and 1 serving of nuts or seeds equals  cup.  Choose whole grains more often. Have 3 servings per day (a serving is 1 ounce [oz]).  Eat 4 to 5 servings of vegetables per day. A serving of vegetables is 1 cup of raw leafy vegetables;  cup of raw or cooked cut-up vegetables;  cup of vegetable juice.  Eat 4 to 5 servings of fruit per day. A serving of fruit is 1 medium whole fruit;  cup of   dried fruit;  cup of fresh, frozen, or canned fruit;  cup of 100% fruit juice.  Increase your intake of dietary fiber to 20 to 30 grams per day. Insoluble fiber may help lower your risk of heart disease and may help curb your appetite.  Soluble fiber binds cholesterol to be removed from the blood. Foods high in soluble fiber are dried beans, citrus fruits, oats, apples, bananas, broccoli, Brussels sprouts, and eggplant.  Try to include foods fortified with plant sterols or stanols, such as yogurt, breads, juices, or margarines. Choose several fortified foods to achieve a daily intake of 2 to 3 grams of plant sterols or stanols.  Foods with omega-3 fats can help reduce your risk of heart disease. Aim to have a 3.5 oz portion of fatty fish twice per week, such as salmon, mackerel, albacore tuna, sardines, lake trout, or herring. If you wish to take a fish oil supplement, choose one that contains 1 gram of both DHA and EPA.  Limit processed meats to 2 servings (3 oz portion) weekly.  Limit the sodium in your diet to 1500 milligrams (mg) per day. If you have high blood pressure, talk to a registered dietitian about a DASH (Dietary Approaches to Stop Hypertension) eating plan.  Limit sweets and beverages with added sugar, such as soda, to no more than 5 servings per week. One serving is:   1 tablespoon sugar.  1 tablespoon  jelly or jam.   cup sorbet.  1 cup lemonade.   cup regular soda. CHOOSING FOODS Starches  Allowed: Breads: All kinds (wheat, rye, raisin, white, oatmeal, Italian, French, and English muffin bread). Low-fat rolls: English muffins, frankfurter and hamburger buns, bagels, pita bread, tortillas (not fried). Pancakes, waffles, biscuits, and muffins made with recommended oil.  Avoid: Products made with saturated or trans fats, oils, or whole milk products. Butter rolls, cheese breads, croissants. Commercial doughnuts, muffins, sweet rolls, biscuits, waffles, pancakes, store-bought mixes. Crackers  Allowed: Low-fat crackers and snacks: Animal, graham, rye, saltine (with recommended oil, no lard), oyster, and matzo crackers. Bread sticks, melba toast, rusks, flatbread, pretzels, and light popcorn.  Avoid: High-fat crackers: cheese crackers, butter crackers, and those made with coconut, palm oil, or trans fat (hydrogenated oils). Buttered popcorn. Cereals  Allowed: Hot or cold whole-grain cereals.  Avoid: Cereals containing coconut, hydrogenated vegetable fat, or animal fat. Potatoes / Pasta / Rice  Allowed: All kinds of potatoes, rice, and pasta (such as macaroni, spaghetti, and noodles).  Avoid: Pasta or rice prepared with cream sauce or high-fat cheese. Chow mein noodles, French fries. Vegetables  Allowed: All vegetables and vegetable juices.  Avoid: Fried vegetables. Vegetables in cream, butter, or high-fat cheese sauces. Limit coconut. Fruit in cream or custard. Protein  Allowed: Limit your intake of meat, seafood, and poultry to no more than 6 oz (cooked weight) per day. All lean, well-trimmed beef, veal, pork, and lamb. All chicken and turkey without skin. All fish and shellfish. Wild game: wild duck, rabbit, pheasant, and venison. Egg whites or low-cholesterol egg substitutes may be used as desired. Meatless dishes: recipes with dried beans, peas, lentils, and tofu (soybean curd).  Seeds and nuts: all seeds and most nuts.  Avoid: Prime grade and other heavily marbled and fatty meats, such as short ribs, spare ribs, rib eye roast or steak, frankfurters, sausage, bacon, and high-fat luncheon meats, mutton. Caviar. Commercially fried fish. Domestic duck, goose, venison sausage. Organ meats: liver, gizzard, heart, chitterlings, brains, kidney, sweetbreads. Dairy  Allowed: Low-fat cheeses: nonfat   or low-fat cottage cheese (1% or 2% fat), cheeses made with part skim milk, such as mozzarella, farmers, string, or ricotta. (Cheeses should be labeled no more than 2 to 6 grams fat per oz.). Skim (or 1%) milk: liquid, powdered, or evaporated. Buttermilk made with low-fat milk. Drinks made with skim or low-fat milk or cocoa. Chocolate milk or cocoa made with skim or low-fat (1%) milk. Nonfat or low-fat yogurt.  Avoid: Whole milk cheeses, including colby, cheddar, muenster, Monterey Jack, Havarti, Brie, Camembert, American, Swiss, and blue. Creamed cottage cheese, cream cheese. Whole milk and whole milk products, including buttermilk or yogurt made from whole milk, drinks made from whole milk. Condensed milk, evaporated whole milk, and 2% milk. Soups and Combination Foods  Allowed: Low-fat low-sodium soups: broth, dehydrated soups, homemade broth, soups with the fat removed, homemade cream soups made with skim or low-fat milk. Low-fat spaghetti, lasagna, chili, and Spanish rice if low-fat ingredients and low-fat cooking techniques are used.  Avoid: Cream soups made with whole milk, cream, or high-fat cheese. All other soups. Desserts and Sweets  Allowed: Sherbet, fruit ices, gelatins, meringues, and angel food cake. Homemade desserts with recommended fats, oils, and milk products. Jam, jelly, honey, marmalade, sugars, and syrups. Pure sugar candy, such as gum drops, hard candy, jelly beans, marshmallows, mints, and small amounts of dark chocolate.  Avoid: Commercially prepared cakes, pies,  cookies, frosting, pudding, or mixes for these products. Desserts containing whole milk products, chocolate, coconut, lard, palm oil, or palm kernel oil. Ice cream or ice cream drinks. Candy that contains chocolate, coconut, butter, hydrogenated fat, or unknown ingredients. Buttered syrups. Fats and Oils  Allowed: Vegetable oils: safflower, sunflower, corn, soybean, cottonseed, sesame, canola, olive, or peanut. Non-hydrogenated margarines. Salad dressing or mayonnaise: homemade or commercial, made with a recommended oil. Low or nonfat salad dressing or mayonnaise.  Limit added fats and oils to 6 to 8 tsp per day (includes fats used in cooking, baking, salads, and spreads on bread). Remember to count the "hidden fats" in foods.  Avoid: Solid fats and shortenings: butter, lard, salt pork, bacon drippings. Gravy containing meat fat, shortening, or suet. Cocoa butter, coconut. Coconut oil, palm oil, palm kernel oil, or hydrogenated oils: these ingredients are often used in bakery products, nondairy creamers, whipped toppings, candy, and commercially fried foods. Read labels carefully. Salad dressings made of unknown oils, sour cream, or cheese, such as blue cheese and Roquefort. Cream, all kinds: half-and-half, light, heavy, or whipping. Sour cream or cream cheese (even if "light" or low-fat). Nondairy cream substitutes: coffee creamers and sour cream substitutes made with palm, palm kernel, hydrogenated oils, or coconut oil. Beverages  Allowed: Coffee (regular or decaffeinated), tea. Diet carbonated beverages, mineral water. Alcohol: Check with your caregiver. Moderation is recommended.  Avoid: Whole milk, regular sodas, and juice drinks with added sugar. Condiments  Allowed: All seasonings and condiments. Cocoa powder. "Cream" sauces made with recommended ingredients.  Avoid: Carob powder made with hydrogenated fats. SAMPLE MENU Breakfast   cup orange juice   cup oatmeal  1 slice toast  1  tsp margarine  1 cup skim milk Lunch  Turkey sandwich with 2 oz turkey, 2 slices bread  Lettuce and tomato slices  Fresh fruit  Carrot sticks  Coffee or tea Snack  Fresh fruit or low-fat crackers Dinner  3 oz lean ground beef  1 baked potato  1 tsp margarine   cup asparagus  Lettuce salad  1 tbs non-creamy dressing   cup peach slices    1 cup skim milk Document Released: 09/16/2008 Document Revised: 06/08/2012 Document Reviewed: 02/07/2014 Exit[employer] Patient Information 2015 [employer]. This information is not intended to replace advice given to you by your health care provider. Make sure you discuss any questions you have with your health care provider.  

## 2014-08-27 ENCOUNTER — Other Ambulatory Visit: Payer: Self-pay | Admitting: Cardiovascular Disease

## 2014-08-29 NOTE — Telephone Encounter (Signed)
Rx was sent to pharmacy electronically. 

## 2014-09-04 ENCOUNTER — Telehealth: Payer: Self-pay | Admitting: Cardiovascular Disease

## 2014-09-04 DIAGNOSIS — E785 Hyperlipidemia, unspecified: Secondary | ICD-10-CM

## 2014-09-04 NOTE — Telephone Encounter (Signed)
Spoke with Keith Francis, she reports we usually check the patients cholesterol. Aware orders placed and they can come by prior to breakfast for labs to be drawn.

## 2014-09-04 NOTE — Telephone Encounter (Signed)
Harriett Sine called in to make an appt for Keith Francis to see Dr. Allyson Sabal and she would like to know if he he needs to have any blood work done. Please call and notify her if so.   Thanks

## 2014-09-19 LAB — LIPID PANEL
Cholesterol: 137 mg/dL (ref 0–200)
HDL: 50 mg/dL (ref >39)
LDL CALC: 75 mg/dL (ref 0–99)
Total CHOL/HDL Ratio: 2.7 Ratio
Triglycerides: 61 mg/dL (ref <150)
VLDL: 12 mg/dL (ref 0–40)

## 2014-09-19 LAB — HEPATIC FUNCTION PANEL
ALBUMIN: 4.1 g/dL (ref 3.5–5.2)
ALK PHOS: 62 U/L (ref 39–117)
ALT: <8 U/L (ref 0–53)
AST: 17 U/L (ref 0–37)
BILIRUBIN DIRECT: 0.1 mg/dL (ref 0.0–0.3)
BILIRUBIN TOTAL: 0.7 mg/dL (ref 0.2–1.2)
Indirect Bilirubin: 0.6 mg/dL (ref 0.2–1.2)
Total Protein: 6.6 g/dL (ref 6.0–8.3)

## 2014-09-20 ENCOUNTER — Encounter: Payer: Self-pay | Admitting: *Deleted

## 2014-09-26 ENCOUNTER — Other Ambulatory Visit: Payer: Self-pay | Admitting: Cardiovascular Disease

## 2014-09-26 NOTE — Telephone Encounter (Signed)
Rx was sent to pharmacy electronically. OV 10/9 with Dr. Allyson SabalBerry

## 2014-09-29 ENCOUNTER — Ambulatory Visit (INDEPENDENT_AMBULATORY_CARE_PROVIDER_SITE_OTHER): Payer: Medicare Other | Admitting: Cardiovascular Disease

## 2014-09-29 ENCOUNTER — Encounter: Payer: Self-pay | Admitting: Cardiovascular Disease

## 2014-09-29 VITALS — BP 140/60 | HR 64 | Ht 60.0 in | Wt 131.0 lb

## 2014-09-29 DIAGNOSIS — I252 Old myocardial infarction: Secondary | ICD-10-CM

## 2014-09-29 DIAGNOSIS — Z23 Encounter for immunization: Secondary | ICD-10-CM

## 2014-09-29 DIAGNOSIS — E785 Hyperlipidemia, unspecified: Secondary | ICD-10-CM

## 2014-09-29 DIAGNOSIS — I10 Essential (primary) hypertension: Secondary | ICD-10-CM

## 2014-09-29 DIAGNOSIS — I2510 Atherosclerotic heart disease of native coronary artery without angina pectoris: Secondary | ICD-10-CM

## 2014-09-29 DIAGNOSIS — I2583 Coronary atherosclerosis due to lipid rich plaque: Secondary | ICD-10-CM

## 2014-09-29 MED ORDER — ATORVASTATIN CALCIUM 80 MG PO TABS: 80.0000 mg | ORAL_TABLET | Freq: Every day | ORAL | Status: DC

## 2014-09-29 NOTE — Patient Instructions (Signed)
We request that you follow-up in: 6 months with an extender and in 1 year with Dr Berry  You will receive a reminder letter in the mail two months in advance. If you don't receive a letter, please call our office to schedule the follow-up appointment.   

## 2014-09-29 NOTE — Assessment & Plan Note (Signed)
Controlled on current medications 

## 2014-09-29 NOTE — Assessment & Plan Note (Signed)
On statin therapy with recent lipid profile performed 09/18/14 revealing a total cholesterol of 137, LDL of 75 and HDL of 50

## 2014-09-29 NOTE — Progress Notes (Signed)
09/29/2014 Keith Francis   Nov 28, 1933  [id number]  Primary Physician Rogelia BogaKWIATKOWSKI,PETER FRANK, MD Primary Cardiologist: Runell GessJonathan J. Kanyla Omeara MD Roseanne RenoFACP,FACC,FAHA, FSCAI   HPI:  Keith Francis is a very pleasant 80 year old thin and frail-appearing married Caucasian male who has severe Parkinson's disease and dementia who is here today for followup. He is in a wheelchair. Accompanied by his wife. I last saw him 02/21/13. He saw Keith RiegerLaura in the, registered nurse practitioner one year ago. He has a history of CAD status post coronary artery bypass grafting x4 in 1996 in [place]. He had stenting of his native right coronary artery using a drug-eluting stent August 2004 in [place]. After that he went on to have a myocardial infarction complicated by cardiogenic shock requiring intra-aortic balloon counterpulsation. His other problems include hypertension, hyperlipidemia and progressive Parkinson's disease. His last Myoview stress test performed 01/26/29 was nonischemic. He denies chest pain or shortness of breath.   Current Outpatient Prescriptions  Medication Sig Dispense Refill  . Ascorbic Acid (VITAMIN C) 500 MG tablet Take 500 mg by mouth daily.        Marland Kitchen. aspirin 81 MG tablet Take 81 mg by mouth daily.        Marland Kitchen. atorvastatin (LIPITOR) 80 MG tablet Take 1 tablet (80 mg total) by mouth daily.  30 tablet  0  . carbidopa-levodopa (SINEMET CR) 50-200 MG per tablet Take 1 tablet by mouth 4 (four) times daily.      . fexofenadine (GNP ALLERGY RELIEF) 180 MG tablet Take 90 mg by mouth daily.       . QUEtiapine (SEROQUEL) 25 MG tablet Take two tablets at night      . rivastigmine (EXELON) 3 MG capsule Take 3 mg by mouth 3 (three) times daily with meals.       No current facility-administered medications for this visit.    No Known Allergies  History   Social History  . Marital Status: Married    Spouse Name: N/A    Number of Children: N/A  . Years of Education: N/A   Occupational History  . Not on  file.   Social History Main Topics  . Smoking status: Never Smoker   . Smokeless tobacco: Never Used  . Alcohol Use: Yes  . Drug Use: No  . Sexual Activity: Not on file   Other Topics Concern  . Not on file   Social History Narrative  . No narrative on file     Review of Systems: General: negative for chills, fever, night sweats or weight changes.  Cardiovascular: negative for chest pain, dyspnea on exertion, edema, orthopnea, palpitations, paroxysmal nocturnal dyspnea or shortness of breath Dermatological: negative for rash Respiratory: negative for cough or wheezing Urologic: negative for hematuria Abdominal: negative for nausea, vomiting, diarrhea, bright red blood per rectum, melena, or hematemesis Neurologic: negative for visual changes, syncope, or dizziness All other systems reviewed and are otherwise negative except as noted above.    Blood pressure 140/60, pulse 64, height 5' (1.524 m), weight 131 lb (59.421 kg).  General appearance: alert and no distress Neck: no adenopathy, no carotid bruit, no JVD, supple, symmetrical, trachea midline and thyroid not enlarged, symmetric, no tenderness/mass/nodules Lungs: clear to auscultation bilaterally Heart: regular rate and rhythm, S1, S2 normal, no murmur, click, rub or gallop Extremities: extremities normal, atraumatic, no cyanosis or edema  EKG normal sinus rhythm at 84 without ST or T wave changes  ASSESSMENT AND PLAN:   Essential hypertension Controlled on current medications  Hyperlipidemia On statin therapy with recent lipid profile performed 09/18/14 revealing a total cholesterol of 137, LDL of 75 and HDL of 50  CAD (coronary artery disease) History of CAD status post coronary artery bypass grafting x4 in 1996 at [place]. He had stenting of his native right coronary artery with a drug-eluting stent August 2004 and [place]. Did have a myocardial infarction complicated by cardiogenic  shock requiring intra-aortic  balloon counterpulsation. In my view performed 01/27/12 which was nonischemic. He denies chest pain or shortness of breath.      Runell GessJonathan J. Davion Flannery MD FACP,FACC,FAHA, [facility] 09/29/2014 11:16 AM

## 2014-09-29 NOTE — Assessment & Plan Note (Signed)
History of CAD status post coronary artery bypass grafting x4 in 1996 at [place]. He had stenting of his native right coronary artery with a drug-eluting stent August 2004 and [place]. Did have a myocardial infarction complicated by cardiogenic  shock requiring intra-aortic balloon counterpulsation. In my view performed 01/27/12 which was nonischemic. He denies chest pain or shortness of breath.

## 2014-10-16 ENCOUNTER — Ambulatory Visit (INDEPENDENT_AMBULATORY_CARE_PROVIDER_SITE_OTHER): Payer: Medicare Other | Admitting: *Deleted

## 2014-10-16 DIAGNOSIS — Z111 Encounter for screening for respiratory tuberculosis: Secondary | ICD-10-CM

## 2014-10-18 ENCOUNTER — Telehealth: Payer: Self-pay | Admitting: Internal Medicine

## 2014-10-18 LAB — TB SKIN TEST
Induration: 0 mm
TB Skin Test: NEGATIVE

## 2014-10-18 NOTE — Telephone Encounter (Signed)
Wife instructed to cb after pt's tb test was read today

## 2014-10-18 NOTE — Telephone Encounter (Signed)
Spoke to pt's wife, she said home nurse read TB skin test this AM and it was Negative and she will be dropping off FL2 form to be filled out for pt. Told her okay will keep look out for papers. TB skin test documented.

## 2014-10-19 NOTE — Telephone Encounter (Signed)
Facility representative brought FL 2 Form, completed and signed by Dr.K.

## 2014-10-25 ENCOUNTER — Telehealth: Payer: Self-pay | Admitting: Internal Medicine

## 2014-10-25 NOTE — Telephone Encounter (Signed)
All ok

## 2014-10-25 NOTE — Telephone Encounter (Signed)
[facility], gave verbal order for pt fornursing, hypertension management and education and physical therapy for unsteady gate and decrease balance okay per Dr. Francene BoyersK. Tara verbalized understanding.

## 2014-10-25 NOTE — Telephone Encounter (Signed)
[facility]  needs verbal order for pt for  nursing, hypertension management and education and physical therapy for unsteady gate and decrease balance

## 2014-10-25 NOTE — Telephone Encounter (Signed)
Please see message and advise if orders are okay for pt?

## 2014-10-31 ENCOUNTER — Telehealth: Payer: Self-pay | Admitting: Internal Medicine

## 2014-10-31 NOTE — Telephone Encounter (Signed)
David physical therapist  needs verbal order for plan of care. Plan of care for physical therapy is three times a wk for 2 wks and twice a wk for 4 wks .

## 2014-10-31 NOTE — Telephone Encounter (Signed)
Please advise if PT okay for pt?

## 2014-10-31 NOTE — Telephone Encounter (Signed)
ok 

## 2014-10-31 NOTE — Telephone Encounter (Signed)
Called [facility] verbal order given per Dr. Kirtland BouchardK, for Plan of care for physical therapy  three times a wk for 2 wks and twice a wk for 4 wks for pt. Onalee HuaDavid verbalized understanding.

## 2014-11-03 ENCOUNTER — Telehealth: Payer: Self-pay | Admitting: *Deleted

## 2014-11-03 NOTE — Telephone Encounter (Signed)
Received phone call from [facility] nurse regarding pt's blood pressure is elevated at 150/96 and recheck was 152/90. She said pt is asymptomatic but wanted to know if should monitor. Arrie Senateold Laurie to monitor blood pressure daily x 1 week and fax results to Dr. Kirtland BouchardK, if blood pressure continues to be greater than 150/90 to make an appointment. Jacki ConesLaurie verbalized understanding.

## 2014-11-07 NOTE — Telephone Encounter (Signed)
FYI

## 2014-11-10 ENCOUNTER — Telehealth: Payer: Self-pay | Admitting: Internal Medicine

## 2014-11-10 NOTE — Telephone Encounter (Signed)
FYI, see message. 

## 2014-11-10 NOTE — Telephone Encounter (Signed)
Nurse wanted PCP to be aware that pt was seen today, upon arrival BP was 174/80.  Rechecked BP after 45 minutes and it was done to 162/72.

## 2014-11-22 DIAGNOSIS — G20 Parkinson's disease: Secondary | ICD-10-CM

## 2014-11-22 DIAGNOSIS — F0280 Dementia in other diseases classified elsewhere without behavioral disturbance: Secondary | ICD-10-CM

## 2014-11-22 DIAGNOSIS — I2510 Atherosclerotic heart disease of native coronary artery without angina pectoris: Secondary | ICD-10-CM

## 2014-11-22 DIAGNOSIS — I10 Essential (primary) hypertension: Secondary | ICD-10-CM

## 2014-12-04 ENCOUNTER — Other Ambulatory Visit: Payer: Self-pay | Admitting: Internal Medicine

## 2014-12-04 MED ORDER — ACETAMINOPHEN 325 MG PO TABS: 650.0000 mg | ORAL_TABLET | Freq: Two times a day (BID) | ORAL | Status: AC | PRN

## 2014-12-10 ENCOUNTER — Emergency Department (HOSPITAL_COMMUNITY): Payer: Medicare Other

## 2014-12-10 ENCOUNTER — Emergency Department (HOSPITAL_COMMUNITY)
Admission: EM | Admit: 2014-12-10 | Discharge: 2014-12-10 | Disposition: A | Discharge status: Home / Self Care | Payer: Medicare Other | Attending: Emergency Medicine | Admitting: Emergency Medicine

## 2014-12-10 ENCOUNTER — Encounter (HOSPITAL_COMMUNITY): Payer: Self-pay | Admitting: Emergency Medicine

## 2014-12-10 DIAGNOSIS — Z043 Encounter for examination and observation following other accident: Secondary | ICD-10-CM | POA: Insufficient documentation

## 2014-12-10 DIAGNOSIS — Z9861 Coronary angioplasty status: Secondary | ICD-10-CM | POA: Diagnosis not present

## 2014-12-10 DIAGNOSIS — Z8669 Personal history of other diseases of the nervous system and sense organs: Secondary | ICD-10-CM | POA: Diagnosis not present

## 2014-12-10 DIAGNOSIS — W01198A Fall on same level from slipping, tripping and stumbling with subsequent striking against other object, initial encounter: Secondary | ICD-10-CM | POA: Insufficient documentation

## 2014-12-10 DIAGNOSIS — W19XXXA Unspecified fall, initial encounter: Secondary | ICD-10-CM

## 2014-12-10 DIAGNOSIS — Y92128 Other place in nursing home as the place of occurrence of the external cause: Secondary | ICD-10-CM | POA: Insufficient documentation

## 2014-12-10 DIAGNOSIS — I10 Essential (primary) hypertension: Secondary | ICD-10-CM | POA: Diagnosis not present

## 2014-12-10 DIAGNOSIS — Y998 Other external cause status: Secondary | ICD-10-CM | POA: Insufficient documentation

## 2014-12-10 DIAGNOSIS — F0280 Dementia in other diseases classified elsewhere without behavioral disturbance: Secondary | ICD-10-CM | POA: Insufficient documentation

## 2014-12-10 DIAGNOSIS — Z951 Presence of aortocoronary bypass graft: Secondary | ICD-10-CM | POA: Insufficient documentation

## 2014-12-10 DIAGNOSIS — Y9389 Activity, other specified: Secondary | ICD-10-CM | POA: Insufficient documentation

## 2014-12-10 DIAGNOSIS — Z8739 Personal history of other diseases of the musculoskeletal system and connective tissue: Secondary | ICD-10-CM | POA: Diagnosis not present

## 2014-12-10 DIAGNOSIS — Z9889 Other specified postprocedural states: Secondary | ICD-10-CM | POA: Diagnosis not present

## 2014-12-10 DIAGNOSIS — Z79899 Other long term (current) drug therapy: Secondary | ICD-10-CM | POA: Diagnosis not present

## 2014-12-10 DIAGNOSIS — Z7982 Long term (current) use of aspirin: Secondary | ICD-10-CM | POA: Diagnosis not present

## 2014-12-10 DIAGNOSIS — I2510 Atherosclerotic heart disease of native coronary artery without angina pectoris: Secondary | ICD-10-CM | POA: Diagnosis not present

## 2014-12-10 DIAGNOSIS — I252 Old myocardial infarction: Secondary | ICD-10-CM | POA: Diagnosis not present

## 2014-12-10 DIAGNOSIS — E785 Hyperlipidemia, unspecified: Secondary | ICD-10-CM | POA: Insufficient documentation

## 2014-12-10 DIAGNOSIS — G20 Parkinson's disease: Secondary | ICD-10-CM | POA: Diagnosis not present

## 2014-12-10 NOTE — ED Notes (Signed)
Patient transported to X-ray 

## 2014-12-10 NOTE — ED Notes (Signed)
Pt from [facility]. Unwitnessed fall tonight. Patient states he hit his head but also has severe dementia. No obvious signs of injury. No complaints at this time. Oriented per norm.

## 2014-12-10 NOTE — ED Notes (Signed)
Bed: [id number] Expected date: 12/10/14 Expected time: 6:18 PM Means of arrival: Ambulance Comments: Fall/ no injury/policy to have pt checked

## 2014-12-10 NOTE — Discharge Instructions (Signed)

## 2014-12-10 NOTE — ED Provider Notes (Signed)
CSN: [id number]     Arrival date & time 12/10/14  1820 History   First MD Initiated Contact with Patient 12/10/14 1824     Chief Complaint  Patient presents with  . Fall     (Consider location/radiation/quality/duration/timing/severity/associated sxs/prior Treatment) HPI Comments: Patient with history of CAD, MI, Parkinson's disease, dementia, hypertension, presents to the emergency department with chief complaint of unwitnessed fall.  Patient stays in the [facility].  Patient tells nursing staff that he hit his head, however he tells me that he did not hit his head. He states that he hit his right shoulder. He denies any pain. History is difficult to elicit secondary to dementia. Level 5 caveat applies.  The history is provided by the patient. No language interpreter was used.    Past Medical History  Diagnosis Date  . CAD (coronary artery disease)     hx CABG in [place] [facility]-1996, stent to native Rt wth DES 2004 hx cardiogenic shock, IABP, myoview 2013 negative ischemia  . Myocardial infarction 2004  . Osteopenia   . Parkinson disease   . Hyperlipidemia   . Hypertension   . 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 Hypotension 02/2013    lisinopril stopped. + falls   Past Surgical History  Procedure Laterality Date  . Coronary artery bypass graft  1996    X 4  . Total knee arthroplasty      bil  . Carotid stent      x3  . Cardiac catheterization  06/20/03    occl VG-RCA, occl. VG-LCX, patent VG-DIAG, distal LIMA MOD DISEASE  EF 45%  . Coronary angioplasty with stent placement  07/31/03    DES TO prox, mid and distal RCA   Family History  Problem Relation Age of Onset  . Heart disease Mother   . Hypertension Mother   . Arthritis Mother   . Heart disease Father   . Kidney disease Father     renal failure  . Heart disease Sister   . Lung disease Brother    History  Substance Use Topics  . Smoking status: Never Smoker   . Smokeless tobacco: Never Used   . Alcohol Use: Yes    Review of Systems  Unable to perform ROS: Dementia      Allergies  Review of patient's allergies indicates no known allergies.  Home Medications   Prior to Admission medications   Medication Sig Start Date End Date Taking? Authorizing Provider  acetaminophen (TYLENOL) 325 MG tablet Take 2 tablets (650 mg total) by mouth every 12 (twelve) hours as needed. 12/04/14   Doe-Hyun Sherran Needs, DO  Ascorbic Acid (VITAMIN C) 500 MG tablet Take 500 mg by mouth daily.      Historical Provider, MD  aspirin 81 MG tablet Take 81 mg by mouth daily.      Historical Provider, MD  atorvastatin (LIPITOR) 80 MG tablet Take 1 tablet (80 mg total) by mouth daily. 09/29/14   Runell Gess, MD  carbidopa-levodopa (SINEMET CR) 50-200 MG per tablet Take 1 tablet by mouth 4 (four) times daily.    Historical Provider, MD  fexofenadine (GNP ALLERGY RELIEF) 180 MG tablet Take 90 mg by mouth daily.  03/16/14   Historical Provider, MD  QUEtiapine (SEROQUEL) 25 MG tablet Take two tablets at night    Historical Provider, MD  rivastigmine (EXELON) 3 MG capsule Take 3 mg by mouth 3 (three) times daily with meals.    Historical Provider,  MD   There were no vitals taken for this visit. Physical Exam  Constitutional: He is oriented to person, place, and time. He appears well-developed and well-nourished.  HENT:  Head: Normocephalic and atraumatic.  No scalp hematoma, no battle sign, no raccoon eyes, no sign of injury  Eyes: Conjunctivae and EOM are normal. Pupils are equal, round, and reactive to light. Right eye exhibits no discharge. Left eye exhibits no discharge. No scleral icterus.  Neck: Normal range of motion. Neck supple. No JVD present.  Cardiovascular: Normal rate, regular rhythm and normal heart sounds.  Exam reveals no gallop and no friction rub.   No murmur heard. Pulmonary/Chest: Effort normal and breath sounds normal. No respiratory distress. He has no wheezes. He has no rales. He  exhibits no tenderness.  Abdominal: Soft. He exhibits no distension and no mass. There is no tenderness. There is no rebound and no guarding.  Musculoskeletal: Normal range of motion. He exhibits no edema or tenderness.  No CTLS spine tenderness, step-off, or deformity, range of motion of all extremities 5/5  Neurological: He is alert and oriented to person, place, and time.  Skin: Skin is warm and dry.  Psychiatric: He has a normal mood and affect. His behavior is normal. Judgment and thought content normal.  Nursing note and vitals reviewed.   ED Course  Procedures (including critical care time) Results for orders placed or performed in visit on 10/16/14  TB Skin Test  Result Value Ref Range   TB Skin Test Negative    Induration 0 mm   Dg Shoulder Right  12/10/2014   CLINICAL DATA:  Altered mental status. The patient could not be positioned properly for axillary view due to immobility.  EXAM: RIGHT SHOULDER - 2+ VIEW  COMPARISON:  None.  FINDINGS: There is no evidence of fracture or dislocation. There is no evidence of arthropathy or other focal bone abnormality. Soft tissues are unremarkable. An ossific fragment adjacent to the humeral head may represent calcific tendinitis. Mild right glenohumeral joint degenerative change is noted.  IMPRESSION: Negative.   Electronically Signed   By: Christiana PellantGretchen  Green M.D.   On: 12/10/2014 18:51   Ct Head Wo Contrast  12/10/2014   CLINICAL DATA:  Status post fall today. The fall was not witnessed. Blow to the head. The patient is demented. Initial encounter.  EXAM: CT HEAD WITHOUT CONTRAST  CT CERVICAL SPINE WITHOUT CONTRAST  TECHNIQUE: Multidetector CT imaging of the head and cervical spine was performed following the standard protocol without intravenous contrast. Multiplanar CT image reconstructions of the cervical spine were also generated.  COMPARISON:  Head CT scan 02/15/2013.  FINDINGS: CT HEAD FINDINGS  There is cortical atrophy and chronic  microvascular ischemic change. No evidence of acute intracranial abnormality including infarct, hemorrhage, mass lesion, mass effect, midline shift or abnormal extra-axial fluid collection is identified. There is no hydrocephalus or pneumocephalus. The calvarium is intact.  CT CERVICAL SPINE FINDINGS  No fracture is identified. Trace anterolisthesis C3 on C4 is due to facet arthropathy. There is multilevel loss of disc space height. Lung apices are clear.  IMPRESSION: No acute abnormality head or cervical spine.  Atrophy and chronic microvascular ischemic change.  Multilevel cervical spondylosis.   Electronically Signed   By: Drusilla Kannerhomas  Dalessio M.D.   On: 12/10/2014 19:48   Ct Cervical Spine Wo Contrast  12/10/2014   CLINICAL DATA:  Status post fall today. The fall was not witnessed. Blow to the head. The patient is demented. Initial  encounter.  EXAM: CT HEAD WITHOUT CONTRAST  CT CERVICAL SPINE WITHOUT CONTRAST  TECHNIQUE: Multidetector CT imaging of the head and cervical spine was performed following the standard protocol without intravenous contrast. Multiplanar CT image reconstructions of the cervical spine were also generated.  COMPARISON:  Head CT scan 02/15/2013.  FINDINGS: CT HEAD FINDINGS  There is cortical atrophy and chronic microvascular ischemic change. No evidence of acute intracranial abnormality including infarct, hemorrhage, mass lesion, mass effect, midline shift or abnormal extra-axial fluid collection is identified. There is no hydrocephalus or pneumocephalus. The calvarium is intact.  CT CERVICAL SPINE FINDINGS  No fracture is identified. Trace anterolisthesis C3 on C4 is due to facet arthropathy. There is multilevel loss of disc space height. Lung apices are clear.  IMPRESSION: No acute abnormality head or cervical spine.  Atrophy and chronic microvascular ischemic change.  Multilevel cervical spondylosis.   Electronically Signed   By: Drusilla Kannerhomas  Dalessio M.D.   On: 12/10/2014 19:48      EKG  Interpretation None      MDM   Final diagnoses:  Fall    Patient with unwitnessed fall. He is alert, and not complaining of any symptoms at this time. He has a history of dementia. Will check CT head and neck, and also right shoulder film.  Imaging is negative.  Patient feels well.  Requests a sandwich.   Patient discussed with Dr. Romeo AppleHarrison, who agrees with plan for discharge to home.    Roxy HorsemanRobert Tamula Morrical, PA-C 12/10/14 2007  Purvis SheffieldForrest Harrison, MD 12/12/14 [phone]

## 2014-12-26 ENCOUNTER — Telehealth: Payer: Self-pay | Admitting: Internal Medicine

## 2014-12-26 NOTE — Telephone Encounter (Signed)
Glendora Scorealled Joan, gave verbal order for Occupational Therapy twice a week for 5 weeks to address self feeding, positioning, decrease fall risk, care giver training for pt, okay per Dr. Aubery LappingK. Joan verbalized understanding.

## 2014-12-26 NOTE — Telephone Encounter (Signed)
ok 

## 2014-12-26 NOTE — Telephone Encounter (Signed)
Keith GraffJoan would like an order for twice a week for 5 weeks to address self feeding, positioning, decrease fall risk, care giver training. Verbal order is okay to leave on voicemail if needed.

## 2014-12-26 NOTE — Telephone Encounter (Signed)
Please advise if okay for orders. °

## 2014-12-27 ENCOUNTER — Telehealth: Payer: Self-pay | Admitting: Internal Medicine

## 2014-12-27 NOTE — Telephone Encounter (Signed)
[facility] would like to confirm change in home health occupational therapy to 1 week / 1 wk  // Week 2 /5 x wk Effective week of Jan 14, 2015

## 2014-12-28 ENCOUNTER — Telehealth: Payer: Self-pay | Admitting: Internal Medicine

## 2014-12-28 NOTE — Telephone Encounter (Signed)
ok 

## 2014-12-28 NOTE — Telephone Encounter (Signed)
Please advise if okay for Speech Therapy?

## 2014-12-28 NOTE — Telephone Encounter (Signed)
Would like a verbal order to provide speech language pathology services for treatment of feeding difficulties and aphasia, 2 times per week for 6 weeks. Ok to leave verbal order on voicemail if needed.

## 2014-12-28 NOTE — Telephone Encounter (Signed)
Spoke to CoalgateJoan and confirmed order change in home health occupational therapy to 1 week / 1 wk // Week 2 /5 x wk Effective week of Jan 14, 2015 okay per Dr. Aubery LappingK. Joan verbalized understanding.

## 2014-12-28 NOTE — Telephone Encounter (Signed)
Please call to confirm recommended change in frequency of home PT

## 2014-12-29 NOTE — Telephone Encounter (Signed)
Called and spoke to Aram BeechamJoan Smith Occupational Therapist since Toniann FailWendy was not in yet, verbal order given for Speech Language Pathology services for treatment of feeding difficulties and aphasia, 2 times per week for 6 weeks okay per Dr. Aubery LappingK. Joan verbalized understanding and will give Toniann FailWendy the message.

## 2015-01-03 DIAGNOSIS — R1312 Dysphagia, oropharyngeal phase: Secondary | ICD-10-CM

## 2015-01-03 DIAGNOSIS — S51011D Laceration without foreign body of right elbow, subsequent encounter: Secondary | ICD-10-CM | POA: Diagnosis not present

## 2015-01-03 DIAGNOSIS — G20 Parkinson's disease: Secondary | ICD-10-CM

## 2015-01-03 DIAGNOSIS — G309 Alzheimer's disease, unspecified: Secondary | ICD-10-CM

## 2015-01-05 ENCOUNTER — Emergency Department (HOSPITAL_COMMUNITY)
Admission: EM | Admit: 2015-01-05 | Discharge: 2015-01-06 | Disposition: A | Discharge status: Home / Self Care | Payer: Medicare Other | Attending: Emergency Medicine | Admitting: Emergency Medicine

## 2015-01-05 ENCOUNTER — Encounter (HOSPITAL_COMMUNITY): Payer: Self-pay | Admitting: Emergency Medicine

## 2015-01-05 DIAGNOSIS — Y9289 Other specified places as the place of occurrence of the external cause: Secondary | ICD-10-CM | POA: Diagnosis not present

## 2015-01-05 DIAGNOSIS — Z79899 Other long term (current) drug therapy: Secondary | ICD-10-CM | POA: Insufficient documentation

## 2015-01-05 DIAGNOSIS — Z951 Presence of aortocoronary bypass graft: Secondary | ICD-10-CM | POA: Insufficient documentation

## 2015-01-05 DIAGNOSIS — Y998 Other external cause status: Secondary | ICD-10-CM | POA: Diagnosis not present

## 2015-01-05 DIAGNOSIS — W19XXXA Unspecified fall, initial encounter: Secondary | ICD-10-CM

## 2015-01-05 DIAGNOSIS — Y9389 Activity, other specified: Secondary | ICD-10-CM | POA: Insufficient documentation

## 2015-01-05 DIAGNOSIS — Z9889 Other specified postprocedural states: Secondary | ICD-10-CM | POA: Diagnosis not present

## 2015-01-05 DIAGNOSIS — Z7982 Long term (current) use of aspirin: Secondary | ICD-10-CM | POA: Diagnosis not present

## 2015-01-05 DIAGNOSIS — F0390 Unspecified dementia without behavioral disturbance: Secondary | ICD-10-CM | POA: Insufficient documentation

## 2015-01-05 DIAGNOSIS — G20 Parkinson's disease: Secondary | ICD-10-CM | POA: Diagnosis not present

## 2015-01-05 DIAGNOSIS — I2510 Atherosclerotic heart disease of native coronary artery without angina pectoris: Secondary | ICD-10-CM | POA: Diagnosis not present

## 2015-01-05 DIAGNOSIS — S0292XA Unspecified fracture of facial bones, initial encounter for closed fracture: Secondary | ICD-10-CM | POA: Diagnosis not present

## 2015-01-05 DIAGNOSIS — S0993XA Unspecified injury of face, initial encounter: Secondary | ICD-10-CM | POA: Diagnosis present

## 2015-01-05 DIAGNOSIS — I10 Essential (primary) hypertension: Secondary | ICD-10-CM | POA: Insufficient documentation

## 2015-01-05 DIAGNOSIS — E785 Hyperlipidemia, unspecified: Secondary | ICD-10-CM | POA: Diagnosis not present

## 2015-01-05 NOTE — ED Provider Notes (Signed)
CSN: [id number]     Arrival date & time 01/05/15  2134 History   First MD Initiated Contact with Patient 01/05/15 2306     Chief Complaint  Patient presents with  . Fall     (Consider location/radiation/quality/duration/timing/severity/associated sxs/prior Treatment) HPI Patient presents from nursing home after unwitnessed fall. Has a history of dementia and Parkinson's disease. Patient is unable to give history. Level V caveat applies. Staff of the nursing home found patient's the side turned over wheelchair. Unknown new trauma. Patient does not take any anticoagulants. Past Medical History  Diagnosis Date  . CAD (coronary artery disease)     hx CABG in [place] [facility]-1996, stent to native Rt wth DES 2004 hx cardiogenic shock, IABP, myoview 2013 negative ischemia  . Myocardial infarction 2004  . Osteopenia   . Parkinson disease   . Hyperlipidemia   . Hypertension   . 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 Hypotension 02/2013    lisinopril stopped. + falls   Past Surgical History  Procedure Laterality Date  . Coronary artery bypass graft  1996    X 4  . Total knee arthroplasty      bil  . Carotid stent      x3  . Cardiac catheterization  06/20/03    occl VG-RCA, occl. VG-LCX, patent VG-DIAG, distal LIMA MOD DISEASE  EF 45%  . Coronary angioplasty with stent placement  07/31/03    DES TO prox, mid and distal RCA   Family History  Problem Relation Age of Onset  . Heart disease Mother   . Hypertension Mother   . Arthritis Mother   . Heart disease Father   . Kidney disease Father     renal failure  . Heart disease Sister   . Lung disease Brother    History  Substance Use Topics  . Smoking status: Never Smoker   . Smokeless tobacco: Never Used  . Alcohol Use: Yes    Review of Systems  Unable to perform ROS: Dementia      Allergies  Review of patient's allergies indicates no known allergies.  Home Medications   Prior to Admission medications   Medication Sig  Start Date End Date Taking? Authorizing Provider  Ascorbic Acid (VITAMIN C) 500 MG tablet Take 500 mg by mouth daily.     Yes Historical Provider, MD  aspirin 81 MG tablet Take 81 mg by mouth daily.     Yes Historical Provider, MD  atorvastatin (LIPITOR) 80 MG tablet Take 1 tablet (80 mg total) by mouth daily. 09/29/14  Yes Runell Gess, MD  carbidopa-levodopa (SINEMET CR) 50-200 MG per tablet Take 1 tablet by mouth 4 (four) times daily. Take 1 Tablet at 6 am, Take 1 Tablet at 11 am, Take 1 Tablet at 5 pm, Take 1 Tablet at 8 pm.   Yes Historical Provider, MD  Protein POWD Take 2 scoop by mouth 3 (three) times daily. Mix with 4oz of milk or juice   Yes Historical Provider, MD  QUEtiapine (SEROQUEL) 25 MG tablet Take 50 mg by mouth at bedtime. Take two tablets at night   Yes Historical Provider, MD  rivastigmine (EXELON) 3 MG capsule Take 3 mg by mouth 3 (three) times daily with meals.   Yes Historical Provider, MD  acetaminophen (TYLENOL) 325 MG tablet Take 2 tablets (650 mg total) by mouth every 12 (twelve) hours as needed. 12/04/14   Doe-Hyun R Artist Pais, DO   BP 181/91 mmHg  Pulse 54  Temp(Src) 97.4 F (36.3 C) (Oral)  Resp 25  SpO2 85% Physical Exam  Constitutional: He appears well-developed and well-nourished. No distress.  HENT:  Head: Normocephalic.  Mouth/Throat: Oropharynx is clear and moist.  Patient with right periorbital contusion that appears to be subacute. Midface is stable.  Eyes: EOM are normal. Pupils are equal, round, and reactive to light.  Neck: Normal range of motion. Neck supple.  No posterior midline cervical tenderness to palpation.  Cardiovascular: Normal rate and regular rhythm.   Pulmonary/Chest: Effort normal and breath sounds normal. No respiratory distress. He has no wheezes. He has no rales. He exhibits no tenderness.  Abdominal: Soft. Bowel sounds are normal. He exhibits no distension and no mass. There is no tenderness. There is no rebound and no guarding.   Musculoskeletal: Normal range of motion. He exhibits no edema or tenderness.  Patient with rigidity throughout. No shortening extremities. Distal pulses intact. Pelvis is stable.  Neurological:  Patient is drowsy but arousable. He will follow very simple commands. Appears to be moving all of his extremities.  Skin: Skin is warm and dry. No rash noted. No erythema.  Patient with right upper extremity contusion and skin tears which again appeared to be subacute. They are dressed by nursing home staff.  Nursing note and vitals reviewed.   ED Course  Procedures (including critical care time) Labs Review Labs Reviewed  CBC WITH DIFFERENTIAL - Abnormal; Notable for the following:    RBC 4.07 (*)    Hemoglobin 12.8 (*)    Neutrophils Relative % 80 (*)    Lymphocytes Relative 11 (*)    All other components within normal limits  COMPREHENSIVE METABOLIC PANEL - Abnormal; Notable for the following:    GFR calc non Af Amer 87 (*)    All other components within normal limits  URINALYSIS, ROUTINE W REFLEX MICROSCOPIC    Imaging Review Ct Head Wo Contrast  01/06/2015   CLINICAL DATA:  Unwitnessed fall from wheelchair. Concern for head or cervical spine injury. Old bruising about the right orbit and mandible. Initial encounter.  EXAM: CT HEAD WITHOUT CONTRAST  CT CERVICAL SPINE WITHOUT CONTRAST  TECHNIQUE: Multidetector CT imaging of the head and cervical spine was performed following the standard protocol without intravenous contrast. Multiplanar CT image reconstructions of the cervical spine were also generated.  COMPARISON:  CT of the head and cervical spine performed 12/10/2014  FINDINGS: CT HEAD FINDINGS  There is no evidence of acute infarction, mass lesion, or intra- or extra-axial hemorrhage on CT.  Prominence of the ventricles and sulci reflects moderate cortical volume loss. Diffuse periventricular and subcortical white matter change likely reflects small vessel ischemic microangiopathy.   Relatively stable vaguely decreased attenuation is seen within both cerebellar hemispheres, raising concern for chronic ischemic change. Chronic ischemic change is also noted at the basal ganglia bilaterally, and at the left thalamus.  The brainstem and fourth ventricle are within normal limits. The cerebral hemispheres demonstrate grossly normal gray-white differentiation. No mass effect or midline shift is seen.  There is a new tripod fracture of the right zygomaticomaxillary complex, with a nondisplaced fracture of the right zygomatic arch, and a comminuted fracture involving the anterior and lateral walls of the right maxillary sinus, extending across the orbital floor and demonstrating depression inferiorly, with associated mucosal thickening. The superior buttress is not well assessed on provided images.  A minimally displaced fracture line is seen extending across the lateral wall of the right orbit, though there is minimal if  any cortical irregularity at the level of the lateral rectus muscle.  The orbits are otherwise within normal limits. The remaining paranasal sinuses and mastoid air cells are well-aerated. No significant soft tissue abnormalities are seen.  CT CERVICAL SPINE FINDINGS  There is no evidence of acute fracture or subluxation. There is mild chronic grade 1 anterolisthesis of C3 on C4, and multilevel disc space narrowing and anterior and posterior disc osteophyte complexes are seen along the lower cervical spine. Underlying facet disease is noted. There is also mild grade 1 anterolisthesis of C7 on T1. Multiple bone cysts are likely benign in nature. Vertebral bodies demonstrate normal height. Prevertebral soft tissues are within normal limits.  The thyroid gland is unremarkable in appearance. The visualized lung apices are clear. Dense calcification is seen at the carotid bifurcations bilaterally, with likely moderate left-sided luminal narrowing.  IMPRESSION: 1. No evidence of traumatic  intracranial injury. 2. New tripod fracture of the right zygomaticomaxillary complex, with a nondisplaced fracture of the right zygomatic arch, and a comminuted fracture involving the anterior and lateral walls of the right maxillary sinus, extending across the orbital floor and demonstrating depression inferiorly, with associated mucosal thickening. The superior buttress is not well assessed on provided images. 3. Minimally displaced fracture line extends across the lateral wall of the right orbit, though there is minimal if any cortical irregularity at the level of the lateral rectus muscle. The right orbit is otherwise grossly unremarkable. No evidence of herniation of intraorbital contents. 4. No evidence of acute fracture or subluxation along the cervical spine. 5. Mild diffuse degenerative change along the cervical spine. 6. Moderate cortical volume loss and diffuse small vessel ischemic microangiopathy. 7. Chronic ischemic change at the basal ganglia, and at the left thalamus. Relatively stable vaguely decreased attenuation within both cerebellar hemispheres also raises concern for chronic ischemic change. 8. Dense calcification at the carotid bifurcations bilaterally, with likely moderate left-sided luminal narrowing. Carotid ultrasound would be helpful for further evaluation, when and as deemed clinically appropriate.  These results were called by telephone at the time of interpretation on 01/06/2015 at 1:09 am to Dr. Loren RacerAVID Mackenzey Crownover, who verbally acknowledged these results.   Electronically Signed   By: Roanna RaiderJeffery  Chang M.D.   On: 01/06/2015 01:09   Ct Cervical Spine Wo Contrast  01/06/2015   CLINICAL DATA:  Unwitnessed fall from wheelchair. Concern for head or cervical spine injury. Old bruising about the right orbit and mandible. Initial encounter.  EXAM: CT HEAD WITHOUT CONTRAST  CT CERVICAL SPINE WITHOUT CONTRAST  TECHNIQUE: Multidetector CT imaging of the head and cervical spine was performed  following the standard protocol without intravenous contrast. Multiplanar CT image reconstructions of the cervical spine were also generated.  COMPARISON:  CT of the head and cervical spine performed 12/10/2014  FINDINGS: CT HEAD FINDINGS  There is no evidence of acute infarction, mass lesion, or intra- or extra-axial hemorrhage on CT.  Prominence of the ventricles and sulci reflects moderate cortical volume loss. Diffuse periventricular and subcortical white matter change likely reflects small vessel ischemic microangiopathy.  Relatively stable vaguely decreased attenuation is seen within both cerebellar hemispheres, raising concern for chronic ischemic change. Chronic ischemic change is also noted at the basal ganglia bilaterally, and at the left thalamus.  The brainstem and fourth ventricle are within normal limits. The cerebral hemispheres demonstrate grossly normal gray-white differentiation. No mass effect or midline shift is seen.  There is a new tripod fracture of the right zygomaticomaxillary complex, with a nondisplaced  fracture of the right zygomatic arch, and a comminuted fracture involving the anterior and lateral walls of the right maxillary sinus, extending across the orbital floor and demonstrating depression inferiorly, with associated mucosal thickening. The superior buttress is not well assessed on provided images.  A minimally displaced fracture line is seen extending across the lateral wall of the right orbit, though there is minimal if any cortical irregularity at the level of the lateral rectus muscle.  The orbits are otherwise within normal limits. The remaining paranasal sinuses and mastoid air cells are well-aerated. No significant soft tissue abnormalities are seen.  CT CERVICAL SPINE FINDINGS  There is no evidence of acute fracture or subluxation. There is mild chronic grade 1 anterolisthesis of C3 on C4, and multilevel disc space narrowing and anterior and posterior disc osteophyte  complexes are seen along the lower cervical spine. Underlying facet disease is noted. There is also mild grade 1 anterolisthesis of C7 on T1. Multiple bone cysts are likely benign in nature. Vertebral bodies demonstrate normal height. Prevertebral soft tissues are within normal limits.  The thyroid gland is unremarkable in appearance. The visualized lung apices are clear. Dense calcification is seen at the carotid bifurcations bilaterally, with likely moderate left-sided luminal narrowing.  IMPRESSION: 1. No evidence of traumatic intracranial injury. 2. New tripod fracture of the right zygomaticomaxillary complex, with a nondisplaced fracture of the right zygomatic arch, and a comminuted fracture involving the anterior and lateral walls of the right maxillary sinus, extending across the orbital floor and demonstrating depression inferiorly, with associated mucosal thickening. The superior buttress is not well assessed on provided images. 3. Minimally displaced fracture line extends across the lateral wall of the right orbit, though there is minimal if any cortical irregularity at the level of the lateral rectus muscle. The right orbit is otherwise grossly unremarkable. No evidence of herniation of intraorbital contents. 4. No evidence of acute fracture or subluxation along the cervical spine. 5. Mild diffuse degenerative change along the cervical spine. 6. Moderate cortical volume loss and diffuse small vessel ischemic microangiopathy. 7. Chronic ischemic change at the basal ganglia, and at the left thalamus. Relatively stable vaguely decreased attenuation within both cerebellar hemispheres also raises concern for chronic ischemic change. 8. Dense calcification at the carotid bifurcations bilaterally, with likely moderate left-sided luminal narrowing. Carotid ultrasound would be helpful for further evaluation, when and as deemed clinically appropriate.  These results were called by telephone at the time of  interpretation on 01/06/2015 at 1:09 am to Dr. Loren Racer, who verbally acknowledged these results.   Electronically Signed   By: Roanna Raider M.D.   On: 01/06/2015 01:09     EKG Interpretation   Date/Time:  Friday January 05 2015 23:39:46 EST Ventricular Rate:  81 PR Interval:  156 QRS Duration: 97 QT Interval:  398 QTC Calculation: 462 R Axis:   93 Text Interpretation:  Sinus rhythm Right axis deviation Confirmed by  Ranae Palms  MD, Emaley Applin ([id number]) on 01/05/2015 11:51:33 PM      MDM   Final diagnoses:  Fall  Multiple facial fractures, closed, initial encounter   Patient with no intracranial findings. He does have multiple fractures of the right orbit. No evidence of entrapment. These are likely subacute given the bruising coloration around the right eye. Laboratory workup is normal. The patient can be safely discharged back to nursing home. Will need outpatient maxillofacial follow-up though I doubt the patient will be a candidate for any type of surgery.  Loren Racer, MD 01/06/15 [phone]

## 2015-01-05 NOTE — ED Notes (Signed)
Bed: WA08 Expected date:  Expected time:  Means of arrival:  Comments: 81 yr old fall from wheel chair

## 2015-01-05 NOTE — ED Notes (Signed)
EKG given to EDP Yelverton for review 

## 2015-01-05 NOTE — ED Notes (Signed)
[facility] presents with a 81 yo male from [facility] who had an unwitnessed fall from wheelchair this evening.  Staff found pt on floor with wheelchair turned over beside him and called 911 to check patient out.  No obvious signs of trauma.  Pt has hx of dementia and cannot verbalize pain accurately.  No known drug allergies.  No blood thinners.

## 2015-01-06 ENCOUNTER — Emergency Department (HOSPITAL_COMMUNITY): Payer: Medicare Other

## 2015-01-06 DIAGNOSIS — S0292XA Unspecified fracture of facial bones, initial encounter for closed fracture: Secondary | ICD-10-CM | POA: Diagnosis not present

## 2015-01-06 LAB — URINALYSIS, ROUTINE W REFLEX MICROSCOPIC
Bilirubin Urine: NEGATIVE
Glucose, UA: NEGATIVE mg/dL
Hgb urine dipstick: NEGATIVE
KETONES UR: NEGATIVE mg/dL
LEUKOCYTES UA: NEGATIVE
Nitrite: NEGATIVE
PROTEIN: NEGATIVE mg/dL
Specific Gravity, Urine: 1.024 (ref 1.005–1.030)
UROBILINOGEN UA: 1 mg/dL (ref 0.0–1.0)
pH: 6 (ref 5.0–8.0)

## 2015-01-06 LAB — CBC WITH DIFFERENTIAL/PLATELET
BASOS PCT: 0 % (ref 0–1)
Basophils Absolute: 0 10*3/uL (ref 0.0–0.1)
EOS PCT: 1 % (ref 0–5)
Eosinophils Absolute: 0.1 10*3/uL (ref 0.0–0.7)
HCT: 39.2 % (ref 39.0–52.0)
Hemoglobin: 12.8 g/dL — ABNORMAL LOW (ref 13.0–17.0)
LYMPHS PCT: 11 % — AB (ref 12–46)
Lymphs Abs: 1 10*3/uL (ref 0.7–4.0)
MCH: 31.4 pg (ref 26.0–34.0)
MCHC: 32.7 g/dL (ref 30.0–36.0)
MCV: 96.3 fL (ref 78.0–100.0)
MONO ABS: 0.7 10*3/uL (ref 0.1–1.0)
MONOS PCT: 8 % (ref 3–12)
Neutro Abs: 7 10*3/uL (ref 1.7–7.7)
Neutrophils Relative %: 80 % — ABNORMAL HIGH (ref 43–77)
PLATELETS: 161 10*3/uL (ref 150–400)
RBC: 4.07 MIL/uL — ABNORMAL LOW (ref 4.22–5.81)
RDW: 13.2 % (ref 11.5–15.5)
WBC: 8.8 10*3/uL (ref 4.0–10.5)

## 2015-01-06 LAB — COMPREHENSIVE METABOLIC PANEL
ALK PHOS: 91 U/L (ref 39–117)
ALT: <5 U/L (ref 0–53)
AST: 16 U/L (ref 0–37)
Albumin: 4.2 g/dL (ref 3.5–5.2)
Anion gap: 6 (ref 5–15)
BILIRUBIN TOTAL: 0.6 mg/dL (ref 0.3–1.2)
BUN: 23 mg/dL (ref 6–23)
CO2: 26 mmol/L (ref 19–32)
Calcium: 8.5 mg/dL (ref 8.4–10.5)
Chloride: 105 mEq/L (ref 96–112)
Creatinine, Ser: 0.71 mg/dL (ref 0.50–1.35)
GFR calc Af Amer: >90 mL/min (ref >90)
GFR, EST NON AFRICAN AMERICAN: 87 mL/min — AB (ref >90)
Glucose, Bld: 97 mg/dL (ref 70–99)
Potassium: 4 mmol/L (ref 3.5–5.1)
SODIUM: 137 mmol/L (ref 135–145)
TOTAL PROTEIN: 6.5 g/dL (ref 6.0–8.3)

## 2015-01-06 NOTE — Discharge Instructions (Signed)
Facial Fracture °A facial fracture is a break in one of the bones of your face. °HOME CARE INSTRUCTIONS  °· Protect the injured part of your face until it is healed. °· Do not participate in activities which give chance for re-injury until your doctor approves. °· Gently wash and dry your face. °· Wear head and facial protection while riding a bicycle, motorcycle, or snowmobile. °SEEK MEDICAL CARE IF:  °· An oral temperature above 102° F (38.9° C) develops. °· You have severe headaches or notice changes in your vision. °· You have new numbness or tingling in your face. °· You develop nausea (feeling sick to your stomach), vomiting or a stiff neck. °SEEK IMMEDIATE MEDICAL CARE IF:  °· You develop difficulty seeing or experience double vision. °· You become dizzy, lightheaded, or faint. °· You develop trouble speaking, breathing, or swallowing. °· You have a watery discharge from your nose or ear. °MAKE SURE YOU:  °· Understand these instructions. °· Will watch your condition. °· Will get help right away if you are not doing well or get worse. °Document Released: 12/08/2005 Document Revised: 03/01/2012 Document Reviewed: 07/27/2008 °Exit[employer]® Patient Information ©2015 [employer]. This information is not intended to replace advice given to you by your health care provider. Make sure you discuss any questions you have with your health care provider. ° ° ° °

## 2015-01-06 NOTE — ED Notes (Signed)
[facility] here for transport. 

## 2015-01-06 NOTE — ED Notes (Signed)
Pending arrival of EMS transport, pt mildly restless and fidgety, hands tremulous, resists and works against BP cuff (and BPs), grabbing and pulling at lines/ wires, VSS, NAD, repositioned and reassured.

## 2015-01-06 NOTE — ED Notes (Signed)
Gave report to Izola PriceKatina Wray, Med Tech; advised Ms. Ronne BinningWray that [facility] to old facial fractures observed by Dr. Ranae PalmsYelverton on pt x-ray.  Also advised Ms. Ronne BinningWray that [facility] to [facility] resident back to their facility.

## 2015-01-15 ENCOUNTER — Telehealth: Payer: Self-pay | Admitting: *Deleted

## 2015-01-15 NOTE — Telephone Encounter (Signed)
Toniann FailWendy called from [facility] to let Dr Amador CunasKwiatkowski know she is adding orders and goals for augmentative communication with a call system to prevent falls due to he has been falling a lot.  Please call Toniann FailWendy at  (cell# [phone]).

## 2015-01-16 NOTE — Telephone Encounter (Signed)
Called Toniann FailWendy and left detailed message to see if she is needing orders or just telling us she is  adding orders and goals for augmentative communication with a call system to prevent falls due to he has been falling a lot.  If so verbal order given okay for pt.

## 2015-01-30 ENCOUNTER — Telehealth: Payer: Self-pay | Admitting: Internal Medicine

## 2015-01-30 NOTE — Telephone Encounter (Signed)
Dee at [facility] needs a copy of pt's TB skin test faxed, done 10/16/14 Fax [phone]

## 2015-01-30 NOTE — Telephone Encounter (Signed)
TB skin [facility] at [phone].

## 2015-04-03 ENCOUNTER — Encounter (HOSPITAL_COMMUNITY): Payer: Self-pay | Admitting: Emergency Medicine

## 2015-04-03 ENCOUNTER — Emergency Department (HOSPITAL_COMMUNITY)
Admission: EM | Admit: 2015-04-03 | Discharge: 2015-04-04 | Disposition: A | Discharge status: Home / Self Care | Payer: Medicare Other | Attending: Emergency Medicine | Admitting: Emergency Medicine

## 2015-04-03 ENCOUNTER — Emergency Department (HOSPITAL_COMMUNITY): Payer: Medicare Other

## 2015-04-03 DIAGNOSIS — Z79899 Other long term (current) drug therapy: Secondary | ICD-10-CM | POA: Insufficient documentation

## 2015-04-03 DIAGNOSIS — F0390 Unspecified dementia without behavioral disturbance: Secondary | ICD-10-CM | POA: Insufficient documentation

## 2015-04-03 DIAGNOSIS — Z7982 Long term (current) use of aspirin: Secondary | ICD-10-CM | POA: Insufficient documentation

## 2015-04-03 DIAGNOSIS — Z951 Presence of aortocoronary bypass graft: Secondary | ICD-10-CM | POA: Diagnosis not present

## 2015-04-03 DIAGNOSIS — G20 Parkinson's disease: Secondary | ICD-10-CM | POA: Insufficient documentation

## 2015-04-03 DIAGNOSIS — I252 Old myocardial infarction: Secondary | ICD-10-CM | POA: Diagnosis not present

## 2015-04-03 DIAGNOSIS — I2510 Atherosclerotic heart disease of native coronary artery without angina pectoris: Secondary | ICD-10-CM | POA: Diagnosis not present

## 2015-04-03 DIAGNOSIS — R251 Tremor, unspecified: Secondary | ICD-10-CM | POA: Diagnosis present

## 2015-04-03 DIAGNOSIS — I10 Essential (primary) hypertension: Secondary | ICD-10-CM | POA: Diagnosis not present

## 2015-04-03 DIAGNOSIS — Z9989 Dependence on other enabling machines and devices: Secondary | ICD-10-CM | POA: Insufficient documentation

## 2015-04-03 DIAGNOSIS — E785 Hyperlipidemia, unspecified: Secondary | ICD-10-CM | POA: Diagnosis not present

## 2015-04-03 HISTORY — DX: Unspecified dementia, unspecified severity, without behavioral disturbance, psychotic disturbance, mood disturbance, and anxiety: F03.90

## 2015-04-03 LAB — URINALYSIS, ROUTINE W REFLEX MICROSCOPIC
Bilirubin Urine: NEGATIVE
Glucose, UA: NEGATIVE mg/dL
Hgb urine dipstick: NEGATIVE
KETONES UR: NEGATIVE mg/dL
NITRITE: NEGATIVE
PH: 5 (ref 5.0–8.0)
Protein, ur: NEGATIVE mg/dL
SPECIFIC GRAVITY, URINE: 1.026 (ref 1.005–1.030)
UROBILINOGEN UA: 0.2 mg/dL (ref 0.0–1.0)

## 2015-04-03 LAB — COMPREHENSIVE METABOLIC PANEL
ALBUMIN: 4 g/dL (ref 3.5–5.2)
ALT: 6 U/L (ref 0–53)
AST: 30 U/L (ref 0–37)
Alkaline Phosphatase: 66 U/L (ref 39–117)
Anion gap: 11 (ref 5–15)
BUN: 40 mg/dL — ABNORMAL HIGH (ref 6–23)
CO2: 23 mmol/L (ref 19–32)
CREATININE: 0.86 mg/dL (ref 0.50–1.35)
Calcium: 8.8 mg/dL (ref 8.4–10.5)
Chloride: 111 mmol/L (ref 96–112)
GFR calc Af Amer: >90 mL/min (ref >90)
GFR calc non Af Amer: 80 mL/min — ABNORMAL LOW (ref >90)
Glucose, Bld: 113 mg/dL — ABNORMAL HIGH (ref 70–99)
Potassium: 4 mmol/L (ref 3.5–5.1)
Sodium: 145 mmol/L (ref 135–145)
TOTAL PROTEIN: 7.2 g/dL (ref 6.0–8.3)
Total Bilirubin: 0.7 mg/dL (ref 0.3–1.2)

## 2015-04-03 LAB — CBC
HCT: 41 % (ref 39.0–52.0)
Hemoglobin: 12.8 g/dL — ABNORMAL LOW (ref 13.0–17.0)
MCH: 31.6 pg (ref 26.0–34.0)
MCHC: 31.2 g/dL (ref 30.0–36.0)
MCV: 101.2 fL — AB (ref 78.0–100.0)
PLATELETS: 213 10*3/uL (ref 150–400)
RBC: 4.05 MIL/uL — ABNORMAL LOW (ref 4.22–5.81)
RDW: 14.2 % (ref 11.5–15.5)
WBC: 12.3 10*3/uL — ABNORMAL HIGH (ref 4.0–10.5)

## 2015-04-03 LAB — I-STAT TROPONIN, ED: Troponin i, poc: 0 ng/mL (ref 0.00–0.08)

## 2015-04-03 LAB — URINE MICROSCOPIC-ADD ON

## 2015-04-03 NOTE — Progress Notes (Signed)
CSW met with pt and daughter at bedside. Daughter confirms that the pt is from [facility]. She states that the pt presents to [facility] due to him shaking more than normal and sweating profusely while he was at the facility.   Daughter confirms that the pt has a hx of dementia and parkinsons.  Daughter informed CSW that the pt has fallen within the past 6 months and that he lives in the memory care unit of the facility. Also, she states that the pt receives assistance with his ADL's while at the facility.    Daughter states that the pt's wife is his primary support and that she visits him x2 a day. Also, she says that she visits the pt a couple times a week. She states that her and the pt's wife live in [place].  Daughter states that she does not have any questions or concerns at this time.  Daughter/ Glenice Bow [phone] Wife/ [address] [phone]  Willette Brace [phone] ED CSW 04/03/2015 9:49 PM

## 2015-04-03 NOTE — ED Notes (Addendum)
[facility] from [facility] [phone][facility] Hx of dementia and parkinsons. Staff noticed pt slump over in chair and start drooling more than usual and have more tremors than usual. No falls.

## 2015-04-03 NOTE — ED Provider Notes (Signed)
CSN: [id number]     Arrival date & time 04/03/15  1809 History   First MD Initiated Contact with Patient 04/03/15 2121     Chief Complaint  Patient presents with  . Tremors     (Consider location/radiation/quality/duration/timing/severity/associated sxs/prior Treatment) HPI Comments: History and ROS are limited due to Parkinson's Disease and dementia.  Level V Caveat.   Patient is a 81 y.o. male presenting with general illness.  Illness Quality:  Increased tremor Severity:  Moderate Onset quality:  Sudden Duration:  4 hours Timing:  Constant Progression:  Resolved Chronicity:  New Context:  Hx of Parkinson's disease.  At memory care unit, he developed clamminess and increased tremulousness.   Relieved by:  Nothing Worsened by:  Nothing Associated symptoms: no cough, no diarrhea, no fever and no vomiting   Associated symptoms comment:  "Clammy" Per daughter, pt who sometimes minimally verbal, said he abdomen hurt and he didn't like dinner, stating it was greasy.     Past Medical History  Diagnosis Date  . CAD (coronary artery disease)     [place], stent to native Rt wth DES 2004 hx cardiogenic shock, IABP, myoview 2013 negative ischemia  . Myocardial infarction 2004  . Osteopenia   . Parkinson disease   . Hyperlipidemia   . Hypertension   . 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 Hypotension 02/2013    lisinopril stopped. + falls  . Dementia    Past Surgical History  Procedure Laterality Date  . Coronary artery bypass graft  1996    X 4  . Total knee arthroplasty      bil  . Carotid stent      x3  . Cardiac catheterization  06/20/03    occl VG-RCA, occl. VG-LCX, patent VG-DIAG, distal LIMA MOD DISEASE  EF 45%  . Coronary angioplasty with stent placement  07/31/03    DES TO prox, mid and distal RCA   Family History  Problem Relation Age of Onset  . Heart disease Mother   . Hypertension Mother   . Arthritis Mother   . Heart disease Father    . Kidney disease Father     renal failure  . Heart disease Sister   . Lung disease Brother    History  Substance Use Topics  . Smoking status: Never Smoker   . Smokeless tobacco: Never Used  . Alcohol Use: Yes    Review of Systems  Unable to perform ROS Constitutional: Negative for fever.  Respiratory: Negative for cough.   Gastrointestinal: Negative for vomiting and diarrhea.      Allergies  Review of patient's allergies indicates no known allergies.  Home Medications   Prior to Admission medications   Medication Sig Start Date End Date Taking? Authorizing Provider  acetaminophen (TYLENOL) 325 MG tablet Take 2 tablets (650 mg total) by mouth every 12 (twelve) hours as needed. Patient taking differently: Take 650 mg by mouth every 12 (twelve) hours as needed for mild pain.  12/04/14  Yes Doe-Hyun R Artist PaisYoo, DO  Ascorbic Acid (VITAMIN C) 500 MG tablet Take 500 mg by mouth daily.     Yes Historical Provider, MD  aspirin 81 MG chewable tablet Chew 81 mg by mouth daily.   Yes Historical Provider, MD  atorvastatin (LIPITOR) 80 MG tablet Take 1 tablet (80 mg total) by mouth daily. 09/29/14  Yes Runell GessJonathan J Berry, MD  carbidopa-levodopa (SINEMET CR) 50-200 MG per tablet Take 1 tablet by mouth 4 (  four) times daily. Take 1 Tablet at 7 am, Take 1 Tablet at 12 pm , Take 1 Tablet at 4 pm, Take 1 Tablet at 8 pm.   Yes Historical Provider, MD  ENSURE PLUS (ENSURE PLUS) LIQD Take 237 mLs by mouth 3 (three) times daily between meals.   Yes Historical Provider, MD  Protein POWD Take 2 scoop by mouth 3 (three) times daily. Mix with 4oz of milk or juice   Yes Historical Provider, MD  QUEtiapine (SEROQUEL) 25 MG tablet Take 25 mg by mouth at bedtime. Take two tablets at night   Yes Historical Provider, MD  rivastigmine (EXELON) 3 MG capsule Take 3 mg by mouth 3 (three) times daily with meals.   Yes Historical Provider, MD   BP 151/92 mmHg  Pulse 98  Temp(Src) 99.2 F (37.3 C) (Rectal)  Resp 18   SpO2 100% Physical Exam  Constitutional: He is oriented to person, place, and time. He appears well-developed and well-nourished. No distress.  HENT:  Head: Normocephalic and atraumatic.  Mouth/Throat: Oropharynx is clear and moist.  Eyes: Conjunctivae are normal. Pupils are equal, round, and reactive to light. No scleral icterus.  Neck: Neck supple.  Cardiovascular: Normal rate, regular rhythm, normal heart sounds and intact distal pulses.   No murmur heard. Pulmonary/Chest: Effort normal and breath sounds normal. No stridor. No respiratory distress. He has no wheezes. He has no rales.  Abdominal: Soft. He exhibits no distension. There is no tenderness.  Musculoskeletal: Normal range of motion. He exhibits no edema.  Neurological: He is alert and oriented to person, place, and time.  Tremor most notable in LUE.  Awake and alert.  Non-verbal.  Follows basic commands.  Skin: Skin is warm and dry. No rash noted.  Psychiatric: He has a normal mood and affect. His behavior is normal.  Nursing note and vitals reviewed.   ED Course  Procedures (including critical care time) Labs Review Labs Reviewed  URINALYSIS, ROUTINE W REFLEX MICROSCOPIC - Abnormal; Notable for the following:    APPearance CLOUDY (*)    Leukocytes, UA MODERATE (*)    All other components within normal limits  CBC - Abnormal; Notable for the following:    WBC 12.3 (*)    RBC 4.05 (*)    Hemoglobin 12.8 (*)    MCV 101.2 (*)    All other components within normal limits  COMPREHENSIVE METABOLIC PANEL - Abnormal; Notable for the following:    Glucose, Bld 113 (*)    BUN 40 (*)    GFR calc non Af Amer 80 (*)    All other components within normal limits  URINE MICROSCOPIC-ADD ON    Imaging Review Dg Chest 2 View (if Patient Has Fever And/or Copd)  04/03/2015   CLINICAL DATA:  Productive cough, diaphoresis  EXAM: CHEST  2 VIEW  COMPARISON:  01/10/2014  FINDINGS: Cardiomediastinal silhouette is stable. Status post  CABG. No acute infiltrate or pleural effusion. No pulmonary edema. Degenerative changes bilateral shoulders. Stable degenerative changes thoracic spine.  IMPRESSION: No active cardiopulmonary disease.  No significant change.   Electronically Signed   By: Natasha Mead M.D.   On: 04/03/2015 20:19  All radiology studies independently viewed by me.      EKG Interpretation   Date/Time:  Tuesday April 03 2015 21:52:43 EDT Ventricular Rate:  94 PR Interval:  200 QRS Duration: 89 QT Interval:  376 QTC Calculation: 470 R Axis:   74 Text Interpretation:  Sinus rhythm Baseline wander in  lead(s) V3 No  significant change was found Confirmed by [facility]  MD, TREY ([id number]) on  04/03/2015 10:32:26 PM      MDM   Final diagnoses:  Tremor  Parkinson's disease    Daughter says he is back to his baseline.  She reports that he complained of not liking his greasy dinner.  I suspect this could be a reason why he was clammy.  No other specific symptoms to suggest pathology.  Labs, EKG, and CXR only notable for minimal leukocytosis, unclear etiology.  He is not febrile, septic, or toxic.  Plan dc back to facility.  Given return precautions to daughter and she is comfortable with plan.      Blake Divine, MD 04/03/15 [phone]

## 2015-04-03 NOTE — ED Notes (Signed)
Daughter at bedside, states pt seems to be better than when he first came in, states pt does usually shake but earlier he was shaking more than normal and was clammy.

## 2015-04-03 NOTE — Discharge Instructions (Signed)
Call your doctor or return to the Emergency Department if Mr. Gaynell FaceMarshall develops fevers, chills, vomiting, or other concerning symptoms.

## 2015-04-03 NOTE — ED Notes (Signed)
[facility] called to transport pt back to [facility]

## 2015-04-03 NOTE — ED Notes (Signed)
Bed: [id number] Expected date:  Expected time:  Means of arrival:  Comments: EMS- elderly, increasing Parkinsons

## 2015-04-07 ENCOUNTER — Emergency Department (HOSPITAL_COMMUNITY): Payer: Medicare Other

## 2015-04-07 ENCOUNTER — Inpatient Hospital Stay (HOSPITAL_COMMUNITY)
Admission: EM | Admit: 2015-04-07 | Discharge: 2015-04-11 | DRG: 871 | Disposition: A | Discharge status: Skilled Nursing Facility | Payer: Medicare Other | Attending: Internal Medicine | Admitting: Internal Medicine

## 2015-04-07 ENCOUNTER — Encounter (HOSPITAL_COMMUNITY): Payer: Self-pay | Admitting: Emergency Medicine

## 2015-04-07 DIAGNOSIS — E785 Hyperlipidemia, unspecified: Secondary | ICD-10-CM | POA: Diagnosis present

## 2015-04-07 DIAGNOSIS — E860 Dehydration: Secondary | ICD-10-CM | POA: Diagnosis present

## 2015-04-07 DIAGNOSIS — R4182 Altered mental status, unspecified: Secondary | ICD-10-CM | POA: Diagnosis present

## 2015-04-07 DIAGNOSIS — D638 Anemia in other chronic diseases classified elsewhere: Secondary | ICD-10-CM | POA: Diagnosis present

## 2015-04-07 DIAGNOSIS — Z7982 Long term (current) use of aspirin: Secondary | ICD-10-CM

## 2015-04-07 DIAGNOSIS — E538 Deficiency of other specified B group vitamins: Secondary | ICD-10-CM | POA: Diagnosis present

## 2015-04-07 DIAGNOSIS — Z96653 Presence of artificial knee joint, bilateral: Secondary | ICD-10-CM | POA: Diagnosis present

## 2015-04-07 DIAGNOSIS — I10 Essential (primary) hypertension: Secondary | ICD-10-CM | POA: Diagnosis present

## 2015-04-07 DIAGNOSIS — Z66 Do not resuscitate: Secondary | ICD-10-CM | POA: Diagnosis present

## 2015-04-07 DIAGNOSIS — R6510 Systemic inflammatory response syndrome (SIRS) of non-infectious origin without acute organ dysfunction: Secondary | ICD-10-CM

## 2015-04-07 DIAGNOSIS — M8580 Other specified disorders of bone density and structure, unspecified site: Secondary | ICD-10-CM | POA: Diagnosis present

## 2015-04-07 DIAGNOSIS — G9340 Encephalopathy, unspecified: Secondary | ICD-10-CM | POA: Diagnosis present

## 2015-04-07 DIAGNOSIS — Z681 Body mass index (BMI) 19 or less, adult: Secondary | ICD-10-CM

## 2015-04-07 DIAGNOSIS — N390 Urinary tract infection, site not specified: Secondary | ICD-10-CM | POA: Diagnosis present

## 2015-04-07 DIAGNOSIS — N3000 Acute cystitis without hematuria: Secondary | ICD-10-CM | POA: Diagnosis not present

## 2015-04-07 DIAGNOSIS — F0391 Unspecified dementia with behavioral disturbance: Secondary | ICD-10-CM | POA: Diagnosis not present

## 2015-04-07 DIAGNOSIS — H540 Blindness, both eyes: Secondary | ICD-10-CM | POA: Diagnosis present

## 2015-04-07 DIAGNOSIS — Z955 Presence of coronary angioplasty implant and graft: Secondary | ICD-10-CM

## 2015-04-07 DIAGNOSIS — G20 Parkinson's disease: Secondary | ICD-10-CM | POA: Diagnosis present

## 2015-04-07 DIAGNOSIS — E870 Hyperosmolality and hypernatremia: Secondary | ICD-10-CM | POA: Diagnosis present

## 2015-04-07 DIAGNOSIS — Z951 Presence of aortocoronary bypass graft: Secondary | ICD-10-CM | POA: Diagnosis not present

## 2015-04-07 DIAGNOSIS — A419 Sepsis, unspecified organism: Secondary | ICD-10-CM | POA: Diagnosis present

## 2015-04-07 DIAGNOSIS — R400 Somnolence: Secondary | ICD-10-CM | POA: Diagnosis not present

## 2015-04-07 DIAGNOSIS — E43 Unspecified severe protein-calorie malnutrition: Secondary | ICD-10-CM | POA: Diagnosis present

## 2015-04-07 DIAGNOSIS — Z8679 Personal history of other diseases of the circulatory system: Secondary | ICD-10-CM

## 2015-04-07 DIAGNOSIS — I252 Old myocardial infarction: Secondary | ICD-10-CM

## 2015-04-07 DIAGNOSIS — I2510 Atherosclerotic heart disease of native coronary artery without angina pectoris: Secondary | ICD-10-CM | POA: Diagnosis present

## 2015-04-07 DIAGNOSIS — F0280 Dementia in other diseases classified elsewhere without behavioral disturbance: Secondary | ICD-10-CM | POA: Diagnosis present

## 2015-04-07 LAB — CBC WITH DIFFERENTIAL/PLATELET
BASOS ABS: 0 10*3/uL (ref 0.0–0.1)
BASOS PCT: 0 % (ref 0–1)
BASOS PCT: 0 % (ref 0–1)
Basophils Absolute: 0 10*3/uL (ref 0.0–0.1)
Eosinophils Absolute: 0 10*3/uL (ref 0.0–0.7)
Eosinophils Absolute: 0 10*3/uL (ref 0.0–0.7)
Eosinophils Relative: 0 % (ref 0–5)
Eosinophils Relative: 0 % (ref 0–5)
HEMATOCRIT: 45.1 % (ref 39.0–52.0)
HEMATOCRIT: 41 % (ref 39.0–52.0)
Hemoglobin: 14.2 g/dL (ref 13.0–17.0)
Hemoglobin: 12.9 g/dL — ABNORMAL LOW (ref 13.0–17.0)
Lymphocytes Relative: 5 % — ABNORMAL LOW (ref 12–46)
Lymphocytes Relative: 4 % — ABNORMAL LOW (ref 12–46)
Lymphs Abs: 0.7 10*3/uL (ref 0.7–4.0)
Lymphs Abs: 0.7 10*3/uL (ref 0.7–4.0)
MCH: 32.2 pg (ref 26.0–34.0)
MCH: 31.9 pg (ref 26.0–34.0)
MCHC: 31.5 g/dL (ref 30.0–36.0)
MCHC: 31.5 g/dL (ref 30.0–36.0)
MCV: 102.3 fL — ABNORMAL HIGH (ref 78.0–100.0)
MCV: 101.2 fL — AB (ref 78.0–100.0)
MONOS PCT: 6 % (ref 3–12)
Monocytes Absolute: 0.9 10*3/uL (ref 0.1–1.0)
Monocytes Absolute: 0.6 10*3/uL (ref 0.1–1.0)
Monocytes Relative: 3 % (ref 3–12)
NEUTROS ABS: 13.8 10*3/uL — AB (ref 1.7–7.7)
NEUTROS PCT: 93 % — AB (ref 43–77)
Neutro Abs: 15.7 10*3/uL — ABNORMAL HIGH (ref 1.7–7.7)
Neutrophils Relative %: 89 % — ABNORMAL HIGH (ref 43–77)
PLATELETS: 182 10*3/uL (ref 150–400)
PLATELETS: 189 10*3/uL (ref 150–400)
RBC: 4.41 MIL/uL (ref 4.22–5.81)
RBC: 4.05 MIL/uL — ABNORMAL LOW (ref 4.22–5.81)
RDW: 14.3 % (ref 11.5–15.5)
RDW: 14.3 % (ref 11.5–15.5)
WBC: 15.5 10*3/uL — ABNORMAL HIGH (ref 4.0–10.5)
WBC: 17 10*3/uL — AB (ref 4.0–10.5)

## 2015-04-07 LAB — COMPREHENSIVE METABOLIC PANEL
ALBUMIN: 3.5 g/dL (ref 3.5–5.2)
ALT: 8 U/L (ref 0–53)
ALT: 15 U/L (ref 0–53)
ANION GAP: 9 (ref 5–15)
ANION GAP: 9 (ref 5–15)
AST: 33 U/L (ref 0–37)
AST: 27 U/L (ref 0–37)
Albumin: 3.9 g/dL (ref 3.5–5.2)
Alkaline Phosphatase: 75 U/L (ref 39–117)
Alkaline Phosphatase: 65 U/L (ref 39–117)
BILIRUBIN TOTAL: 0.6 mg/dL (ref 0.3–1.2)
BUN: 27 mg/dL — AB (ref 6–23)
BUN: 24 mg/dL — AB (ref 6–23)
CO2: 25 mmol/L (ref 19–32)
CO2: 24 mmol/L (ref 19–32)
CREATININE: 0.86 mg/dL (ref 0.50–1.35)
Calcium: 9.2 mg/dL (ref 8.4–10.5)
Calcium: 8.4 mg/dL (ref 8.4–10.5)
Chloride: 114 mmol/L — ABNORMAL HIGH (ref 96–112)
Chloride: 115 mmol/L — ABNORMAL HIGH (ref 96–112)
Creatinine, Ser: 0.86 mg/dL (ref 0.50–1.35)
GFR calc non Af Amer: 80 mL/min — ABNORMAL LOW (ref >90)
GFR, EST NON AFRICAN AMERICAN: 80 mL/min — AB (ref >90)
GLUCOSE: 112 mg/dL — AB (ref 70–99)
Glucose, Bld: 100 mg/dL — ABNORMAL HIGH (ref 70–99)
POTASSIUM: 3.8 mmol/L (ref 3.5–5.1)
Potassium: 3.7 mmol/L (ref 3.5–5.1)
SODIUM: 148 mmol/L — AB (ref 135–145)
Sodium: 148 mmol/L — ABNORMAL HIGH (ref 135–145)
TOTAL PROTEIN: 7.2 g/dL (ref 6.0–8.3)
TOTAL PROTEIN: 6.5 g/dL (ref 6.0–8.3)
Total Bilirubin: 0.9 mg/dL (ref 0.3–1.2)

## 2015-04-07 LAB — URINALYSIS, ROUTINE W REFLEX MICROSCOPIC
Bilirubin Urine: NEGATIVE
Glucose, UA: NEGATIVE mg/dL
Hgb urine dipstick: NEGATIVE
Ketones, ur: NEGATIVE mg/dL
Nitrite: NEGATIVE
Protein, ur: NEGATIVE mg/dL
Specific Gravity, Urine: 1.023 (ref 1.005–1.030)
UROBILINOGEN UA: 0.2 mg/dL (ref 0.0–1.0)
pH: 5.5 (ref 5.0–8.0)

## 2015-04-07 LAB — URINE MICROSCOPIC-ADD ON

## 2015-04-07 LAB — I-STAT CG4 LACTIC ACID, ED
LACTIC ACID, VENOUS: 1.24 mmol/L (ref 0.5–2.0)
Lactic Acid, Venous: 2.09 mmol/L (ref 0.5–2.0)

## 2015-04-07 LAB — PROCALCITONIN: Procalcitonin: 1.16 ng/mL

## 2015-04-07 LAB — MRSA PCR SCREENING: MRSA by PCR: NEGATIVE

## 2015-04-07 LAB — I-STAT TROPONIN, ED: Troponin i, poc: 0 ng/mL (ref 0.00–0.08)

## 2015-04-07 LAB — PROTIME-INR
INR: 1.19 (ref 0.00–1.49)
Prothrombin Time: 15.2 seconds (ref 11.6–15.2)

## 2015-04-07 LAB — APTT: aPTT: 24 seconds (ref 24–37)

## 2015-04-07 LAB — LACTIC ACID, PLASMA
Lactic Acid, Venous: 1.3 mmol/L (ref 0.5–2.0)
Lactic Acid, Venous: 1.1 mmol/L (ref 0.5–2.0)

## 2015-04-07 MED ORDER — VANCOMYCIN HCL IN DEXTROSE 1-5 GM/200ML-% IV SOLN
1000.0000 mg | Freq: Once | INTRAVENOUS | Status: AC
  Administered 2015-04-07: 1000 mg via INTRAVENOUS
  Filled 2015-04-07: qty 200

## 2015-04-07 MED ORDER — PIPERACILLIN-TAZOBACTAM 3.375 G IVPB 30 MIN
3.3750 g | Freq: Once | INTRAVENOUS | Status: AC
  Administered 2015-04-07: 3.375 g via INTRAVENOUS
  Filled 2015-04-07: qty 50

## 2015-04-07 MED ORDER — QUETIAPINE FUMARATE 25 MG PO TABS
50.0000 mg | ORAL_TABLET | Freq: Every day | ORAL | Status: DC
  Administered 2015-04-10: 50 mg via ORAL
  Filled 2015-04-07 (×2): qty 2

## 2015-04-07 MED ORDER — ONDANSETRON HCL 4 MG PO TABS: 4.0000 mg | ORAL_TABLET | Freq: Four times a day (QID) | ORAL | Status: DC | PRN

## 2015-04-07 MED ORDER — CARBIDOPA-LEVODOPA ER 50-200 MG PO TBCR
1.0000 | EXTENDED_RELEASE_TABLET | ORAL | Status: DC
  Administered 2015-04-08 – 2015-04-11 (×11): 1 via ORAL
  Filled 2015-04-07 (×19): qty 1

## 2015-04-07 MED ORDER — SODIUM CHLORIDE 0.9 % IJ SOLN
3.0000 mL | Freq: Two times a day (BID) | INTRAMUSCULAR | Status: DC
  Administered 2015-04-09 (×2): 3 mL via INTRAVENOUS

## 2015-04-07 MED ORDER — VANCOMYCIN HCL 500 MG IV SOLR
500.0000 mg | Freq: Two times a day (BID) | INTRAVENOUS | Status: DC
Start: 2015-04-07 — End: 2015-04-09
  Administered 2015-04-07 – 2015-04-09 (×4): 500 mg via INTRAVENOUS
  Filled 2015-04-07 (×4): qty 500

## 2015-04-07 MED ORDER — ATORVASTATIN CALCIUM 10 MG PO TABS
20.0000 mg | ORAL_TABLET | Freq: Every day | ORAL | Status: DC
  Administered 2015-04-09 – 2015-04-10 (×2): 20 mg via ORAL
  Filled 2015-04-07 (×2): qty 2

## 2015-04-07 MED ORDER — PIPERACILLIN-TAZOBACTAM 3.375 G IVPB
3.3750 g | Freq: Three times a day (TID) | INTRAVENOUS | Status: DC
  Administered 2015-04-07 – 2015-04-09 (×5): 3.375 g via INTRAVENOUS
  Filled 2015-04-07 (×7): qty 50

## 2015-04-07 MED ORDER — SODIUM CHLORIDE 0.9 % IV BOLUS (SEPSIS)
1000.0000 mL | INTRAVENOUS | Status: AC
  Administered 2015-04-07 (×2): 1000 mL via INTRAVENOUS

## 2015-04-07 MED ORDER — ACETAMINOPHEN 650 MG RE SUPP
650.0000 mg | Freq: Once | RECTAL | Status: AC
  Administered 2015-04-07: 650 mg via RECTAL
  Filled 2015-04-07: qty 1

## 2015-04-07 MED ORDER — VITAMIN C 500 MG PO TABS
500.0000 mg | ORAL_TABLET | Freq: Every day | ORAL | Status: DC
  Administered 2015-04-09 – 2015-04-11 (×3): 500 mg via ORAL
  Filled 2015-04-07 (×3): qty 1

## 2015-04-07 MED ORDER — ENSURE ENLIVE PO LIQD
237.0000 mL | Freq: Two times a day (BID) | ORAL | Status: DC
  Administered 2015-04-09 – 2015-04-11 (×4): 237 mL via ORAL

## 2015-04-07 MED ORDER — ENOXAPARIN SODIUM 40 MG/0.4ML ~~LOC~~ SOLN
40.0000 mg | SUBCUTANEOUS | Status: DC
  Administered 2015-04-07 – 2015-04-09 (×3): 40 mg via SUBCUTANEOUS
  Filled 2015-04-07 (×4): qty 0.4

## 2015-04-07 MED ORDER — ONDANSETRON HCL 4 MG/2ML IJ SOLN: 4.0000 mg | Freq: Four times a day (QID) | INTRAMUSCULAR | Status: DC | PRN

## 2015-04-07 MED ORDER — ACETAMINOPHEN 120 MG RE SUPP
120.0000 mg | RECTAL | Status: DC | PRN
  Administered 2015-04-07 (×2): 120 mg via RECTAL
  Filled 2015-04-07 (×5): qty 1

## 2015-04-07 MED ORDER — ASPIRIN 81 MG PO CHEW
81.0000 mg | CHEWABLE_TABLET | Freq: Every day | ORAL | Status: DC
  Administered 2015-04-09 – 2015-04-11 (×3): 81 mg via ORAL
  Filled 2015-04-07 (×3): qty 1

## 2015-04-07 MED ORDER — SODIUM CHLORIDE 0.9 % IV SOLN
INTRAVENOUS | Status: DC
  Administered 2015-04-07 – 2015-04-08 (×3): via INTRAVENOUS

## 2015-04-07 NOTE — ED Notes (Signed)
Bed: WA03 Expected date: 04/07/15 Expected time: 10:35 AM Means of arrival: Ambulance Comments: Elderly not acting right

## 2015-04-07 NOTE — ED Notes (Signed)
Spoke with Jessica, charge RN 

## 2015-04-07 NOTE — ED Provider Notes (Signed)
CSN: [id number]     Arrival date & time 04/07/15  1039 History   First MD Initiated Contact with Patient 04/07/15 1045     Chief Complaint  Patient presents with  . Altered Mental Status     (Consider location/radiation/quality/duration/timing/severity/associated sxs/prior Treatment) Patient is a 81 y.o. male presenting with altered mental status. The history is provided by the EMS personnel.  Altered Mental Status Presenting symptoms: confusion   Severity:  Moderate Most recent episode:  Yesterday Episode history:  Continuous Timing:  Constant Progression:  Worsening Chronicity:  New Context: dementia   Associated symptoms: fever (99 at facility per report)   Associated symptoms: no abdominal pain, no headaches, no nausea and no vomiting     Past Medical History  Diagnosis Date  . CAD (coronary artery disease)     [place] in [place], stent to native Rt wth DES 2004 hx cardiogenic shock, IABP, myoview 2013 negative ischemia  . Myocardial infarction 2004  . Osteopenia   . Parkinson disease   . Hyperlipidemia   . Hypertension   . 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 Hypotension 02/2013    lisinopril stopped. + falls  . Dementia    Past Surgical History  Procedure Laterality Date  . Coronary artery bypass graft  1996    X 4  . Total knee arthroplasty      bil  . Carotid stent      x3  . Cardiac catheterization  06/20/03    occl VG-RCA, occl. VG-LCX, patent VG-DIAG, distal LIMA MOD DISEASE  EF 45%  . Coronary angioplasty with stent placement  07/31/03    DES TO prox, mid and distal RCA   Family History  Problem Relation Age of Onset  . Heart disease Mother   . Hypertension Mother   . Arthritis Mother   . Heart disease Father   . Kidney disease Father     renal failure  . Heart disease Sister   . Lung disease Brother    History  Substance Use Topics  . Smoking status: Never Smoker   . Smokeless tobacco: Never Used  . Alcohol Use: Yes    Review  of Systems  Constitutional: Positive for fever (99 at facility per report).  HENT: Negative for drooling and rhinorrhea.   Eyes: Negative for pain.  Respiratory: Positive for cough. Negative for shortness of breath.   Cardiovascular: Negative for chest pain and leg swelling.  Gastrointestinal: Negative for nausea, vomiting, abdominal pain and diarrhea.  Genitourinary: Negative for dysuria and hematuria.  Musculoskeletal: Negative for gait problem and neck pain.  Skin: Negative for color change.  Neurological: Negative for numbness and headaches.  Hematological: Negative for adenopathy.  Psychiatric/Behavioral: Positive for confusion. Negative for behavioral problems.  All other systems reviewed and are negative.     Allergies  Review of patient's allergies indicates no known allergies.  Home Medications   Prior to Admission medications   Medication Sig Start Date End Date Taking? Authorizing Provider  acetaminophen (TYLENOL) 325 MG tablet Take 2 tablets (650 mg total) by mouth every 12 (twelve) hours as needed. Patient taking differently: Take 650 mg by mouth every 12 (twelve) hours as needed for mild pain.  12/04/14  Yes Doe-Hyun R Artist Pais, DO  Ascorbic Acid (VITAMIN C) 500 MG tablet Take 500 mg by mouth daily.     Yes Historical Provider, MD  aspirin 81 MG chewable tablet Chew 81 mg by mouth daily.   Yes Historical  Provider, MD  atorvastatin (LIPITOR) 20 MG tablet Take 20 mg by mouth daily at 6 PM.  04/05/15  Yes Historical Provider, MD  carbidopa-levodopa (SINEMET CR) 50-200 MG per tablet Take 1 tablet by mouth 4 (four) times daily. Take 1 Tablet at 7 am, Take 1 Tablet at 12 pm , Take 1 Tablet at 4 pm, Take 1 Tablet at 8 pm.   Yes Historical Provider, MD  ENSURE PLUS (ENSURE PLUS) LIQD Take 237 mLs by mouth 3 (three) times daily between meals.   Yes Historical Provider, MD  Protein POWD Take 2 scoop by mouth 3 (three) times daily. Mix with 4oz of milk or juice   Yes Historical Provider,  MD  QUEtiapine (SEROQUEL) 25 MG tablet Take 25 mg by mouth at bedtime. Take two tablets at night   Yes Historical Provider, MD  rivastigmine (EXELON) 4.5 MG capsule Take 4.5 mg by mouth 2 (two) times daily.   Yes Historical Provider, MD   BP 131/96 mmHg  Pulse 101  Temp(Src) 101 F (38.3 C) (Rectal)  Resp 17  SpO2 97% Physical Exam  Constitutional: He appears well-developed.  Thin appearing. Mildly tremulous.   HENT:  Head: Normocephalic and atraumatic.  Right Ear: External ear normal.  Left Ear: External ear normal.  Nose: Nose normal.  Mouth/Throat: Oropharynx is clear and moist. No oropharyngeal exudate.  Eyes: Conjunctivae and EOM are normal. Pupils are equal, round, and reactive to light.  Neck: Neck supple. No tracheal deviation present.  Cardiovascular: Regular rhythm, normal heart sounds and intact distal pulses.  Exam reveals no gallop and no friction rub.   No murmur heard. HR 101  Pulmonary/Chest: Effort normal and breath sounds normal. No respiratory distress. He has no wheezes.  Abdominal: Soft. Bowel sounds are normal. He exhibits no distension. There is no tenderness. There is no rebound and no guarding.  Genitourinary: Penis normal.  Musculoskeletal: Normal range of motion. He exhibits no edema or tenderness.  Neurological: He is alert. GCS eye subscore is 1. GCS verbal subscore is 2. GCS motor subscore is 5.  Skin: Skin is warm and dry.  Psychiatric: He has a normal mood and affect. His behavior is normal.  Nursing note and vitals reviewed.   ED Course  Procedures (including critical care time) Labs Review Labs Reviewed  CBC WITH DIFFERENTIAL/PLATELET - Abnormal; Notable for the following:    WBC 15.5 (*)    MCV 102.3 (*)    Neutrophils Relative % 89 (*)    Neutro Abs 13.8 (*)    Lymphocytes Relative 5 (*)    All other components within normal limits  COMPREHENSIVE METABOLIC PANEL - Abnormal; Notable for the following:    Sodium 148 (*)    Chloride 114  (*)    Glucose, Bld 100 (*)    BUN 27 (*)    GFR calc non Af Amer 80 (*)    All other components within normal limits  URINALYSIS, ROUTINE W REFLEX MICROSCOPIC - Abnormal; Notable for the following:    Leukocytes, UA SMALL (*)    All other components within normal limits  URINE MICROSCOPIC-ADD ON - Abnormal; Notable for the following:    Bacteria, UA FEW (*)    All other components within normal limits  I-STAT CG4 LACTIC ACID, ED - Abnormal; Notable for the following:    Lactic Acid, Venous 2.09 (*)    All other components within normal limits  CULTURE, BLOOD (ROUTINE X 2)  CULTURE, BLOOD (ROUTINE X 2)  URINE CULTURE  MRSA PCR SCREENING  I-STAT TROPOININ, ED  I-STAT CG4 LACTIC ACID, ED    Imaging Review Ct Head Wo Contrast  04/07/2015   CLINICAL DATA:  Altered mental status.  Normal yesterday.  EXAM: CT HEAD WITHOUT CONTRAST  TECHNIQUE: Contiguous axial images were obtained from the base of the skull through the vertex without intravenous contrast.  COMPARISON:  01/06/2015.  FINDINGS: Diffusely enlarged ventricles and subarachnoid spaces. Patchy white matter low density in both cerebral hemispheres and both cerebellar hemispheres without significant change. Patchy low density is again demonstrated in the medulla, pons and midbrain. This appears more pronounced in the midbrain on the right today. No intracranial hemorrhage or mass lesion. Unremarkable bones and included paranasal sinuses.  IMPRESSION: 1. Mildly progressive ischemic changes in the mid brain on the right. 2. Stable chronic ischemic changes in both cerebral hemispheres, pons, medulla and both cerebellar hemispheres. 3. Stable atrophy. 4. No intracranial hemorrhage or mass effect.   Electronically Signed   By: Beckie SaltsSteven  Reid M.D.   On: 04/07/2015 12:28   Dg Chest Port 1 View  04/07/2015   CLINICAL DATA:  Altered mental status  EXAM: PORTABLE CHEST - 1 VIEW  COMPARISON:  04/03/2015  FINDINGS: Cardiac shadow is within normal  limits. Postoperative changes are again seen. The lungs are clear bilaterally. No bony abnormality is seen.  IMPRESSION: No active disease.   Electronically Signed   By: Alcide CleverMark  Lukens M.D.   On: 04/07/2015 12:11     EKG Interpretation   Date/Time:  Saturday April 07 2015 10:58:49 EDT Ventricular Rate:  103 PR Interval:  174 QRS Duration: 85 QT Interval:  362 QTC Calculation: 474 R Axis:   83 Text Interpretation:  Sinus tachycardia Borderline right axis deviation  Abnormal R-wave progression, late transition No significant change since  last tracing Confirmed by Joniel Graumann  MD, Tekelia Kareem ([id number]) on 04/07/2015  1:29:02 PM      CRITICAL CARE Performed by: Purvis SheffieldHARRISON, Riyanshi Wahab, S Total critical care time: 30 min Critical care time was exclusive of separately billable procedures and treating other patients. Critical care was necessary to treat or prevent imminent or life-threatening deterioration. Critical care was time spent personally by me on the following activities: development of treatment plan with patient and/or surrogate as well as nursing, discussions with consultants, evaluation of patient's response to treatment, examination of patient, obtaining history from patient or surrogate, ordering and performing treatments and interventions, ordering and review of laboratory studies, ordering and review of radiographic studies, pulse oximetry and re-evaluation of patient's condition.   MDM   Final diagnoses:  Altered mental state  SIRS (systemic inflammatory response syndrome)    11:01 AM 81 y.o. male w hx of CAD s/p CABG, parkinsons, HTN, HLP, dementia who presents with worsening confusion since yesterday. He is also had productive cough. Facility reporting the patient is normally ambulatory and able to converse. He has some mild moaning on exam but will not follow commands or answer questions. He is febrile, mildly tachycardic, vital signs otherwise unremarkable. Will call code sepsis and  workup accordingly. .  1:15 PM family at bedside stating that patient does not really speak and is typically nonambulatory. His current mental status is more proximal to his baseline than previously thought.  Pt w/ GCS of 8 requiring close monitoring in the ED.   Purvis SheffieldForrest Vicke Plotner, MD 04/07/15 [phone]

## 2015-04-07 NOTE — H&P (Signed)
[facility] History and Physical  Keith Francis VWU:[id number] DOB: December 26, 1932 DOA: 04/07/2015  Referring physician: ED physician PCP: Keith Cowden, MD   Chief Complaint: Altered mental status  HPI:  Patient is 81 year old male with history of Parkinson disease and dementia, resident of skilled nursing facility, brought to [facility] emergency department via a EMS for further evaluation of progressively worsening altered mental status noted at the facility by nursing staff. Please note the patient is unable to provide any history at the time of the admission, granddaughter at the bedside is not familiar with any specific details. Granddaughter is able to explain that her grandfather is minimally verbal at baseline with tremors, mostly bed bound. Per nursing staff report patient noted to be more lethargic, vital signs notable for T101F. No reported chest pain or shortness of breath, no reported abdominal or urinary concerns.  In emergency department, patient noted to be lethargic, vital signs notable for T101F, heart rate 101, blood work notable for white blood cell count 15.5, NA 148, BUN 27. Chest x-ray with no acute abnormalities, urinalysis with small leukocytes.  Assessment and Plan: Principal Problem:   Sepsis - Criteria for sepsis met on admission, T101 F, HR 101, WBC 15.5 - Source appears to be UTI - Placed on broad-spectrum antibiotics for now until more results are available, vancomycin and Zosyn - Narrow down antibiotic coverage as clinically indicated - Check lactic acid level and blood cultures, procalcitonin level, urine culture - Provide IV fluids - Repeat WBC in the morning Active Problems:   Anemia of chronic disease, macrocytic - Secondary to anemia of B12 deficiency - Hemoglobin is stable on admission with no signs of active bleeding - Repeat CBC in the morning   Acute encephalopathy - Secondary to the above, UTI, hypernatremia - Patient  nonverbal at baseline - Antibiotics and supportive care as noted above   Hypernatremia - Likely of prerenal etiology in the setting of dehydration, sepsis - Provide IV fluids and repeat BMP in the morning   PARKINSON'S DISEASE - Continue home medical regimen - Will need PT/OT evaluation once more medically stable   Parkinson's dementia - CT head noted progression of ischemic changes in the mid brain area the right side with stable chronic ischemic changes in cerebral hemispheres - Will need PT/OT/SLP   Severe protein calorie malnutrition - In the context of acute on chronic illness, progressive or concerns disease and dementia - High risk for aspiration - We'll place order for SLP, nutrition is consulted   DVT prophylaxis - Lovenox SQ  Radiological Exams on Admission: Ct Head Wo Contrast  04/07/2015   CLINICAL DATA:  Altered mental status.  Normal yesterday.  EXAM: CT HEAD WITHOUT CONTRAST  TECHNIQUE: Contiguous axial images were obtained from the base of the skull through the vertex without intravenous contrast.  COMPARISON:  01/06/2015.  FINDINGS: Diffusely enlarged ventricles and subarachnoid spaces. Patchy white matter low density in both cerebral hemispheres and both cerebellar hemispheres without significant change. Patchy low density is again demonstrated in the medulla, pons and midbrain. This appears more pronounced in the midbrain on the right today. No intracranial hemorrhage or mass lesion. Unremarkable bones and included paranasal sinuses.  IMPRESSION: 1. Mildly progressive ischemic changes in the mid brain on the right. 2. Stable chronic ischemic changes in both cerebral hemispheres, pons, medulla and both cerebellar hemispheres. 3. Stable atrophy. 4. No intracranial hemorrhage or mass effect.   Electronically Signed   By: Claudie Revering M.D.   On: 04/07/2015  12:28   Dg Chest Port 1 View  04/07/2015   CLINICAL DATA:  Altered mental status  EXAM: PORTABLE CHEST - 1 VIEW  COMPARISON:   04/03/2015  FINDINGS: Cardiac shadow is within normal limits. Postoperative changes are again seen. The lungs are clear bilaterally. No bony abnormality is seen.  IMPRESSION: No active disease.   Electronically Signed   By: Inez Catalina M.D.   On: 04/07/2015 12:11    Code Status: Full Family Communication: Granddaughter at bedside Disposition Plan: Admit for further evaluation    [name] [phone]   Review of Systems:  Unable to obtain review of system as patient is non verbal    Past Medical History  Diagnosis Date  . CAD (coronary artery disease)     hx CABG in [place]-1996, stent to native Rt wth DES 2004 hx cardiogenic shock, IABP, myoview 2013 negative ischemia  . Myocardial infarction 2004  . Osteopenia   . Parkinson disease   . Hyperlipidemia   . Hypertension   . 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 Hypotension 02/2013    lisinopril stopped. + falls  . Dementia     Past Surgical History  Procedure Laterality Date  . Coronary artery bypass graft  1996    X 4  . Total knee arthroplasty      bil  . Carotid stent      x3  . Cardiac catheterization  06/20/03    occl VG-RCA, occl. VG-LCX, patent VG-DIAG, distal LIMA MOD DISEASE  EF 45%  . Coronary angioplasty with stent placement  07/31/03    DES TO prox, mid and distal RCA    Social History:  reports that he has never smoked. He has never used smokeless tobacco. He reports that he drinks alcohol. He reports that he does not use illicit drugs.  No Known Allergies  Family History  Problem Relation Age of Onset  . Heart disease Mother   . Hypertension Mother   . Arthritis Mother   . Heart disease Father   . Kidney disease Father     renal failure  . Heart disease Sister   . Lung disease Brother     Prior to Admission medications   Medication Sig Start Date End Date Taking? Authorizing Provider  acetaminophen (TYLENOL) 325 MG tablet Take 2 tablets (650 mg total) by mouth every 12 (twelve) hours  as needed. Patient taking differently: Take 650 mg by mouth every 12 (twelve) hours as needed for mild pain.  12/04/14  Yes Doe-Hyun R Shawna Orleans, DO  Ascorbic Acid (VITAMIN C) 500 MG tablet Take 500 mg by mouth daily.     Yes Historical Provider, MD  aspirin 81 MG chewable tablet Chew 81 mg by mouth daily.   Yes Historical Provider, MD  atorvastatin (LIPITOR) 20 MG tablet Take 20 mg by mouth daily at 6 PM.  04/05/15  Yes Historical Provider, MD  carbidopa-levodopa (SINEMET CR) 50-200 MG per tablet Take 1 tablet by mouth 4 (four) times daily. Take 1 Tablet at 7 am, Take 1 Tablet at 12 pm , Take 1 Tablet at 4 pm, Take 1 Tablet at 8 pm.   Yes Historical Provider, MD  ENSURE PLUS (ENSURE PLUS) LIQD Take 237 mLs by mouth 3 (three) times daily between meals.   Yes Historical Provider, MD  Protein POWD Take 2 scoop by mouth 3 (three) times daily. Mix with 4oz of milk or juice   Yes Historical Provider, MD  QUEtiapine (  SEROQUEL) 25 MG tablet Take 25 mg by mouth at bedtime. Take two tablets at night   Yes Historical Provider, MD  rivastigmine (EXELON) 4.5 MG capsule Take 4.5 mg by mouth 2 (two) times daily.   Yes Historical Provider, MD    Physical Exam: Filed Vitals:   04/07/15 1054 04/07/15 1121 04/07/15 1130 04/07/15 1200  BP: 131/96  143/71 132/57  Pulse: 101  93 92  Temp: 101 F (38.3 C)     TempSrc: Rectal     Resp: 17  16 16   Height:  5' 8"  (1.727 m)    Weight:  58.968 kg (130 lb)    SpO2: 97%  97% 95%    Physical Exam  Constitutional: Appears somnolent, opens eyes with sternal rub, non-verbal, not in acute distress HENT: Normocephalic. External right and left ear normal. Dry mucous membranes Eyes: Conjunctivae and EOM are normal. PERRLA, no scleral icterus.  Neck:  No JVD. No tracheal deviation. No thyromegaly.  CVS: Regular rhythm, tachycardic S1/S2 +, no murmurs, no gallops, no carotid bruit.  Pulmonary: Effort and breath sounds normal, no stridor, diminished breath sounds at  bases Abdominal: Soft. BS +,  no distension, tenderness, rebound or guarding.  Musculoskeletal: Range of motion difficult to examine due to increased muscle tone and persistent tremors  Lymphadenopathy: No lymphadenopathy noted, cervical, inguinal. Neuro: Somnolent, opens eyes with sternal rub, moving all 4 extremities spontaneously, nonverbal Skin: Skin is warm and dry. Multiple scattered ecchymosis throughout upper and lower extremities bilaterally Psychiatric: Difficult to assess due to altered mental status  Labs on Admission:  Basic Metabolic Panel:  Recent Labs Lab 04/03/15 1900 04/07/15 1130  NA 145 148*  K 4.0 3.8  CL 111 114*  CO2 23 25  GLUCOSE 113* 100*  BUN 40* 27*  CREATININE 0.86 0.86  CALCIUM 8.8 9.2   Liver Function Tests:  Recent Labs Lab 04/03/15 1900 04/07/15 1130  AST 30 33  ALT 6 8  ALKPHOS 66 75  BILITOT 0.7 0.6  PROT 7.2 7.2  ALBUMIN 4.0 3.9   CBC:  Recent Labs Lab 04/03/15 1900 04/07/15 1130  WBC 12.3* 15.5*  NEUTROABS  --  13.8*  HGB 12.8* 14.2  HCT 41.0 45.1  MCV 101.2* 102.3*  PLT 213 182   EKG: Pending   If 7PM-7AM, please contact night-coverage [email or web address] Password [email or web address] 04/07/2015, 1:36 PM

## 2015-04-07 NOTE — ED Notes (Signed)
Patient here with complaints of altered mental status, last seen normal between 7a-3p yesterday. Normally walks around and talks. Productive cough and low grade fever. Hx of dementia.

## 2015-04-07 NOTE — Progress Notes (Signed)
ANTIBIOTIC CONSULT NOTE - INITIAL  Pharmacy Consult for Vancomycin, Zosyn Indication: rule out sepsis  No Known Allergies  Patient Measurements: Height: 5\' 8"  (172.7 cm) Weight: 130 lb (58.968 kg) IBW/kg (Calculated) : 68.4   Vital Signs: Temp: 101 F (38.3 C) (04/16 1054) Temp Source: Rectal (04/16 1054) BP: 132/57 mmHg (04/16 1200) Pulse Rate: 92 (04/16 1200) Intake/Output from previous day:   Intake/Output from this shift:    Labs:  Recent Labs  04/07/15 1130  WBC 15.5*  HGB 14.2  PLT 182  CREATININE 0.86   Estimated Creatinine Clearance: 58.1 mL/min (by C-G formula based on Cr of 0.86). No results for input(s): VANCOTROUGH, VANCOPEAK, VANCORANDOM, GENTTROUGH, GENTPEAK, GENTRANDOM, TOBRATROUGH, TOBRAPEAK, TOBRARND, AMIKACINPEAK, AMIKACINTROU, AMIKACIN in the last 72 hours.   Microbiology: No results found for this or any previous visit (from the past 720 hour(s)).  Medical History: Past Medical History  Diagnosis Date  . CAD (coronary artery disease)     [place], stent to native Rt wth DES 2004 hx cardiogenic shock, IABP, myoview 2013 negative ischemia  . Myocardial infarction 2004  . Osteopenia   . Parkinson disease   . Hyperlipidemia   . Hypertension   . 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 Hypotension 02/2013    lisinopril stopped. + falls  . Dementia      Assessment: [age] y/oM from facility with PMH of CAD s/p CABG, parkinson's, HLD, HTN, dementia who presents with altered mental status and productive cough. Patient found to be febrile and mildly tachycardic in ED. Code sepsis initiated, and pharmacy consulted to assist with dosing of Vancomycin and Zosyn.  4/16 >> Vancomycin >> 4/16 >> Zosyn >>    4/16: Lactic Acid: 2.09  4/16 blood x 2: sent 4/16 urine: sent   Goal of Therapy:  Vancomycin trough level 15-20 mcg/ml  Appropriate antibiotic dosing for renal function and indication Eradication of infection  Plan:    Vancomycin 1g IV x 1 given in ED. Continue with Vancomycin 500mg  IV q12h.  Plan for Vancomycin trough level at steady state.  Zosyn 3.375g IV x 1 given in ED. Continue with Zosyn 3.375g IV q8h (infuse over 4 hours).  Monitor renal function, cultures, clinical course.   Greer PickerelJigna Chelise Hanger, PharmD, BCPS Pager: [phone] 04/07/2015 12:48 PM

## 2015-04-08 DIAGNOSIS — N3000 Acute cystitis without hematuria: Secondary | ICD-10-CM

## 2015-04-08 DIAGNOSIS — G20 Parkinson's disease: Secondary | ICD-10-CM

## 2015-04-08 DIAGNOSIS — G9340 Encephalopathy, unspecified: Secondary | ICD-10-CM

## 2015-04-08 DIAGNOSIS — A419 Sepsis, unspecified organism: Principal | ICD-10-CM

## 2015-04-08 LAB — BASIC METABOLIC PANEL
Anion gap: 5 (ref 5–15)
BUN: 21 mg/dL (ref 6–23)
CALCIUM: 8.5 mg/dL (ref 8.4–10.5)
CO2: 26 mmol/L (ref 19–32)
CREATININE: 0.91 mg/dL (ref 0.50–1.35)
Chloride: 118 mmol/L — ABNORMAL HIGH (ref 96–112)
GFR calc Af Amer: >90 mL/min (ref >90)
GFR calc non Af Amer: 78 mL/min — ABNORMAL LOW (ref >90)
Glucose, Bld: 95 mg/dL (ref 70–99)
Potassium: 4.3 mmol/L (ref 3.5–5.1)
Sodium: 149 mmol/L — ABNORMAL HIGH (ref 135–145)

## 2015-04-08 LAB — CBC
HCT: 37.9 % — ABNORMAL LOW (ref 39.0–52.0)
HEMOGLOBIN: 11.9 g/dL — AB (ref 13.0–17.0)
MCH: 32.1 pg (ref 26.0–34.0)
MCHC: 31.4 g/dL (ref 30.0–36.0)
MCV: 102.2 fL — AB (ref 78.0–100.0)
PLATELETS: 170 10*3/uL (ref 150–400)
RBC: 3.71 MIL/uL — ABNORMAL LOW (ref 4.22–5.81)
RDW: 14.1 % (ref 11.5–15.5)
WBC: 12 10*3/uL — AB (ref 4.0–10.5)

## 2015-04-08 MED ORDER — HYDRALAZINE HCL 20 MG/ML IJ SOLN
10.0000 mg | Freq: Four times a day (QID) | INTRAMUSCULAR | Status: DC | PRN
  Administered 2015-04-09: 10 mg via INTRAVENOUS
  Filled 2015-04-08: qty 1

## 2015-04-08 NOTE — Progress Notes (Signed)
Pt family stated that pt is normally more awake and alert in the morning. Keith Francis A

## 2015-04-08 NOTE — Care Management Note (Signed)
CARE MANAGEMENT NOTE 04/08/2015  Patient:  Keith Francis,Olden   Account Number:  [id number]  Date Initiated:  04/08/2015  Documentation initiated by:  DAVIS,RHONDA  Subjective/Objective Assessment:   sepsis     Action/Plan:   snf   Anticipated DC Date:  04/11/2015   Anticipated DC Plan:  SKILLED NURSING FACILITY  In-house referral  Clinical Social Worker      DC Planning Services  CM consult      [facility]  NA   Choice offered to / List presented to:  NA           Status of service:  In process, will continue to follow Medicare Important Message given?   (If response is "NO", the following Medicare IM given date fields will be blank) Date Medicare IM given:   Medicare IM given by:   Date Additional Medicare IM given:   Additional Medicare IM given by:    Discharge Disposition:    Per UR Regulation:  Reviewed for med. necessity/level of care/duration of stay  If discussed at Long Length of Stay Meetings, dates discussed:    Comments:  April 08, 2015/Rhonda L. Earlene Plateravis, RN, BSN, CCM. Case Management [facility] [employer] [facility] [phone] No discharge needs present of time of review.

## 2015-04-08 NOTE — H&P (Signed)
[facility] PROGRESS NOTE  Keith MouldsRobert Francis RUE:[id number] DOB: [birth year] DOA: 04/07/2015 PCP: Rogelia BogaKWIATKOWSKI,PETER FRANK, MD  Assessment/Plan: 1. Sepsis -Present on admission, evidenced by temperature 11.7, heart rate of 101, acute encephalopathy, left 1 white count is 17,000. -Suspect source of infection to be urinary tract infection as urinalysis showed presence of bacteria -Blood culture showing no growth after overnight Patient -Pending urine cultures -Will continue 24 more hours of broad-spectrum IV antimicrobial therapy and consider narrowing his antimicrobial regimen in a.m. -A.m. labs showing a downward trend in his white blood cells from 17-12  2.  Acute encephalopathy -Patient is a nursing home patient with a past medical history of Parkinson's disease and dementia, presenting with a significant flushing or decline likely precipitated by acute infectious process. -He is to be septic on admission and started on broad-spectrum IV antimicrobial therapy. -CT scan of brain that revealed acute changes -Continue supportive care -He was evaluated by speech pathology found to have evidence of aspiration, made nothing by mouth. -Will reassess tomorrow  3.   Possible urinary tract infection. -Urinalysis showed the presence of bacteria. Continue IV antibiotic therapy, follow-up on cultures.  4.  Parkinson's disease -Suspect he may have Parkinson's dementia. Will continue carbidopa/levodopa 1 tablet 4 times daily  5.  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 -Patient with advanced dementia, nursing resident, with history of Parkinson's disease. Continue carbidopa/levodopa. -He is on Seroquel 50 mg by mouth daily at bedtime -Patient having evidence of aspiration on swallow evaluation, making him nothing by mouth  Code Status: Full code Family Communication:  Disposition Plan:    Consultants:   Pathology   Antibiotics:  Vancomycin  Zosyn  HPI/Subjective: Patient is a 81 year old  woman with past medical history of Parkinson's disease, advanced dementia, currently resident at a skilled nursing facility was admitted to medicine service on 04/07/2015 when he presented with mental status changes, functional decline, failure to thrive. On arrival he was found to have a temperature 101 with heart rate 101, with lab work showing presence of urinary tract infection. He was started on broad-spectrum IV antibiotic therapy with Zosyn and vancomycin.   Objective: Filed Vitals:   04/08/15 0445  BP: 142/87  Pulse: 76  Temp: 99.9 F (37.7 C)  Resp:     Intake/Output Summary (Last 24 hours) at 04/08/15 1513 Last data filed at 04/08/15 1300  Gross per 24 hour  Intake 1773.75 ml  Output    701 ml  Net 1072.75 ml   Filed Weights   04/07/15 1121 04/07/15 1500 04/08/15 0445  Weight: 58.968 kg (130 lb) 58.968 kg (130 lb) 49.25 kg (108 lb 9.2 oz)    Exam:   General:  Patient is confused, disoriented, minimally verbal, not following commands, chronically ill-appearing   Cardiovascular: Regular rate and rhythm normal S1-S2  Respiratory: Normal respiratory effort, lungs overall clear, coarse upper respiratory sounds  Abdomen: Soft nontender nondistended  Musculoskeletal: Significant bilateral muscle atrophy  Data Reviewed: Basic Metabolic Panel:  Recent Labs Lab 04/03/15 1900 04/07/15 1130 04/07/15 1702 04/08/15 0516  NA 145 148* 148* 149*  K 4.0 3.8 3.7 4.3  CL 111 114* 115* 118*  CO2 23 25 24 26   GLUCOSE 113* 100* 112* 95  BUN 40* 27* 24* 21  CREATININE 0.86 0.86 0.86 0.91  CALCIUM 8.8 9.2 8.4 8.5   Liver Function Tests:  Recent Labs Lab 04/03/15 1900 04/07/15 1130 04/07/15 1702  AST 30 33 27  ALT 6 8 15   ALKPHOS 66 75 65  BILITOT 0.7 0.6 0.9  PROT 7.2 7.2 6.5  ALBUMIN 4.0 3.9 3.5   No results for input(s): LIPASE, AMYLASE in the last 168 hours. No results for input(s): AMMONIA in the last 168 hours. CBC:  Recent Labs Lab 04/03/15 1900  04/07/15 1130 04/07/15 1702 04/08/15 0516  WBC 12.3* 15.5* 17.0* 12.0*  NEUTROABS  --  13.8* 15.7*  --   HGB 12.8* 14.2 12.9* 11.9*  HCT 41.0 45.1 41.0 37.9*  MCV 101.2* 102.3* 101.2* 102.2*  PLT 213 182 189 170   Cardiac Enzymes: No results for input(s): CKTOTAL, CKMB, CKMBINDEX, TROPONINI in the last 168 hours. BNP (last 3 results) No results for input(s): BNP in the last 8760 hours.  ProBNP (last 3 results) No results for input(s): PROBNP in the last 8760 hours.  CBG: No results for input(s): GLUCAP in the last 168 hours.  Recent Results (from the past 240 hour(s))  Blood Culture (routine x 2)     Status: None (Preliminary result)   Collection Time: 04/07/15 11:30 AM  Result Value Ref Range Status   Specimen Description BLOOD RIGHT ARM  4 ML IN [facility] BOTTLE  Final   Special Requests NONE  Final   Culture   Final           BLOOD CULTURE RECEIVED NO GROWTH TO DATE CULTURE WILL BE HELD FOR 5 DAYS BEFORE ISSUING A FINAL NEGATIVE REPORT Performed at [facility]    Report Status PENDING  Incomplete  Blood Culture (routine x 2)     Status: None (Preliminary result)   Collection Time: 04/07/15 11:56 AM  Result Value Ref Range Status   Specimen Description BLOOD RIGHT HAND  Final   Special Requests BOTTLES DRAWN AEROBIC AND ANAEROBIC 3 CC EACH  Final   Culture   Final           BLOOD CULTURE RECEIVED NO GROWTH TO DATE CULTURE WILL BE HELD FOR 5 DAYS BEFORE ISSUING A FINAL NEGATIVE REPORT Performed at [facility]    Report Status PENDING  Incomplete  MRSA PCR Screening     Status: None   Collection Time: 04/07/15  3:35 PM  Result Value Ref Range Status   MRSA by PCR NEGATIVE NEGATIVE Final    Comment:        The GeneXpert MRSA Assay (FDA approved for NASAL specimens only), is one component of a comprehensive MRSA colonization surveillance program. It is not intended to diagnose MRSA infection nor to guide or monitor treatment for MRSA infections.       Studies: Ct Head Wo Contrast  04/07/2015   CLINICAL DATA:  Altered mental status.  Normal yesterday.  EXAM: CT HEAD WITHOUT CONTRAST  TECHNIQUE: Contiguous axial images were obtained from the base of the skull through the vertex without intravenous contrast.  COMPARISON:  01/06/2015.  FINDINGS: Diffusely enlarged ventricles and subarachnoid spaces. Patchy white matter low density in both cerebral hemispheres and both cerebellar hemispheres without significant change. Patchy low density is again demonstrated in the medulla, pons and midbrain. This appears more pronounced in the midbrain on the right today. No intracranial hemorrhage or mass lesion. Unremarkable bones and included paranasal sinuses.  IMPRESSION: 1. Mildly progressive ischemic changes in the mid brain on the right. 2. Stable chronic ischemic changes in both cerebral hemispheres, pons, medulla and both cerebellar hemispheres. 3. Stable atrophy. 4. No intracranial hemorrhage or mass effect.   Electronically Signed   By: Beckie Salts M.D.   On: 04/07/2015 12:28   Dg Chest  Port 1 View  04/07/2015   CLINICAL DATA:  Altered mental status  EXAM: PORTABLE CHEST - 1 VIEW  COMPARISON:  04/03/2015  FINDINGS: Cardiac shadow is within normal limits. Postoperative changes are again seen. The lungs are clear bilaterally. No bony abnormality is seen.  IMPRESSION: No active disease.   Electronically Signed   By: Alcide Clever M.D.   On: 04/07/2015 12:11    Scheduled Meds: . aspirin  81 mg Oral Daily  . atorvastatin  20 mg Oral q1800  . carbidopa-levodopa  1 tablet Oral 4 times per day  . enoxaparin (LOVENOX) injection  40 mg Subcutaneous Q24H  . feeding supplement (ENSURE ENLIVE)  237 mL Oral BID BM  . piperacillin-tazobactam (ZOSYN)  IV  3.375 g Intravenous 3 times per day  . QUEtiapine  50 mg Oral QHS  . sodium chloride  3 mL Intravenous Q12H  . vancomycin  500 mg Intravenous Q12H  . vitamin C  500 mg Oral Daily   Continuous Infusions: .  sodium chloride 75 mL/hr at 04/08/15 8119    Principal Problem:   Sepsis Active Problems:   PARKINSON'S DISEASE   History of cardiovascular disorder   Altered mental state    Time spent: 35 min    Jeralyn Bennett  [facility] Pager [phone]. If 7PM-7AM, please contact night-coverage at [email or web address], password [facility] 04/08/2015, 3:13 PM  LOS: 1 day

## 2015-04-08 NOTE — Evaluation (Signed)
Clinical/Bedside Swallow Evaluation Patient Details  Name: Keith Francis MRN: [id number] Date of Birth: [birth year]  Today's Date: 04/08/2015 Time: SLP Start Time (ACUTE ONLY): 1206 SLP Stop Time (ACUTE ONLY): 1230 SLP Time Calculation (min) (ACUTE ONLY): 24 min  Past Medical History:  Past Medical History  Diagnosis Date  . CAD (coronary artery disease)     hx CABG in [place] [facility]-1996, stent to native Rt wth DES 2004 hx cardiogenic shock, IABP, myoview 2013 negative ischemia  . Myocardial infarction 2004  . Osteopenia   . Parkinson disease   . Hyperlipidemia   . Hypertension   . 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 Hypotension 02/2013    lisinopril stopped. + falls  . Dementia    Past Surgical History:  Past Surgical History  Procedure Laterality Date  . Coronary artery bypass graft  1996    X 4  . Total knee arthroplasty      bil  . Carotid stent      x3  . Cardiac catheterization  06/20/03    occl VG-RCA, occl. VG-LCX, patent VG-DIAG, distal LIMA MOD DISEASE  EF 45%  . Coronary angioplasty with stent placement  07/31/03    DES TO prox, mid and distal RCA   HPI:  81 year old male with history of Parkinson disease, MI, CAD, cortical blindness, hypotention, CABG, dementia, resident of skilled nursing facility admitted for further evaluation of progressively worsening altered mental status. Per chart/family report pt is minimally verbal at baseline. Temp T101F on admission. Found to have UTI. Chest x-ray with no acute abnormalities, urinalysis with small leukocytes. CT No intracranial hemorrhage or mass effect.   Assessment / Plan / Recommendation Clinical Impression  Pt exhibited severe oropharyngeal swallow function with decreased labial seal and decreased manipulation; holding boluses in oral cavity with suspected significant bolus loss to pharynx/?larynx. Therapeutic intervention consisted of dry spoon trials to facilitate swallow initiation and verbal cues for throat  clears/coughs all ineffective for initiating swallow. Despite Parkinson's he is able to produce a strong volitional cough to expel small amount into oral cavity suctioned by SLP (copious amount). Recommend NPO, oral care and continued intervention for dysphagia as pt is able.     Aspiration Risk  Severe    Diet Recommendation NPO   Medication Administration: Via alternative means    Other  Recommendations Oral Care Recommendations: Oral care BID   Follow Up Recommendations  Skilled Nursing facility    Frequency and Duration min 2x/week  2 weeks   Pertinent Vitals/Pain none         Swallow Study           Oral/Motor/Sensory Function Overall Oral Motor/Sensory Function:  (generalized weakness and decr ROM)   Ice Chips Ice chips: Impaired Presentation: Spoon Oral Phase Impairments: Reduced labial seal;Reduced lingual movement/coordination;Impaired anterior to posterior transit Oral Phase Functional Implications: Oral holding Pharyngeal Phase Impairments:  (no swallow initiated)   Thin Liquid Thin Liquid: Impaired Presentation: Spoon Oral Phase Impairments: Reduced labial seal;Reduced lingual movement/coordination;Impaired anterior to posterior transit Oral Phase Functional Implications: Oral holding Pharyngeal  Phase Impairments:  (no swallow initiated)    Nectar Thick Nectar Thick Liquid: Not tested   Honey Thick Honey Thick Liquid: Not tested   Puree Puree: Impaired Presentation: Spoon Oral Phase Impairments: Reduced lingual movement/coordination;Reduced labial seal;Impaired anterior to posterior transit Oral Phase Functional Implications: Oral holding Pharyngeal Phase Impairments:  (swallow was not initiated)   Solid   GO    Solid: Not tested  Royce MacadamiaLitaker, Dare Sanger Willis 04/08/2015,2:37 PM  Breck CoonsLisa Willis Lonell FaceLitaker M.Ed [employer]-SLP Pager [phone]

## 2015-04-09 DIAGNOSIS — F0391 Unspecified dementia with behavioral disturbance: Secondary | ICD-10-CM

## 2015-04-09 LAB — BASIC METABOLIC PANEL
ANION GAP: 9 (ref 5–15)
BUN: 19 mg/dL (ref 6–23)
CO2: 21 mmol/L (ref 19–32)
Calcium: 7.8 mg/dL — ABNORMAL LOW (ref 8.4–10.5)
Chloride: 112 mmol/L (ref 96–112)
Creatinine, Ser: 0.78 mg/dL (ref 0.50–1.35)
GFR calc Af Amer: >90 mL/min (ref >90)
GFR calc non Af Amer: 84 mL/min — ABNORMAL LOW (ref >90)
GLUCOSE: 78 mg/dL (ref 70–99)
POTASSIUM: 3.3 mmol/L — AB (ref 3.5–5.1)
SODIUM: 142 mmol/L (ref 135–145)

## 2015-04-09 LAB — URINE CULTURE
COLONY COUNT: NO GROWTH
Culture: NO GROWTH

## 2015-04-09 LAB — CBC
HCT: 36.5 % — ABNORMAL LOW (ref 39.0–52.0)
Hemoglobin: 11.6 g/dL — ABNORMAL LOW (ref 13.0–17.0)
MCH: 32 pg (ref 26.0–34.0)
MCHC: 31.8 g/dL (ref 30.0–36.0)
MCV: 100.6 fL — ABNORMAL HIGH (ref 78.0–100.0)
Platelets: 160 10*3/uL (ref 150–400)
RBC: 3.63 MIL/uL — AB (ref 4.22–5.81)
RDW: 13.8 % (ref 11.5–15.5)
WBC: 7.8 10*3/uL (ref 4.0–10.5)

## 2015-04-09 LAB — MAGNESIUM: Magnesium: 2 mg/dL (ref 1.5–2.5)

## 2015-04-09 MED ORDER — RESOURCE THICKENUP CLEAR PO POWD
ORAL | Status: DC | PRN
  Filled 2015-04-09: qty 125

## 2015-04-09 MED ORDER — AMLODIPINE BESYLATE 5 MG PO TABS
5.0000 mg | ORAL_TABLET | Freq: Every day | ORAL | Status: DC
  Administered 2015-04-09 – 2015-04-11 (×3): 5 mg via ORAL
  Filled 2015-04-09 (×3): qty 1

## 2015-04-09 MED ORDER — POTASSIUM CHLORIDE CRYS ER 20 MEQ PO TBCR
40.0000 meq | EXTENDED_RELEASE_TABLET | Freq: Once | ORAL | Status: AC
  Administered 2015-04-09: 40 meq via ORAL
  Filled 2015-04-09: qty 2

## 2015-04-09 MED ORDER — AMOXICILLIN-POT CLAVULANATE 875-125 MG PO TABS
1.0000 | ORAL_TABLET | Freq: Two times a day (BID) | ORAL | Status: DC
  Administered 2015-04-09 – 2015-04-11 (×4): 1 via ORAL
  Filled 2015-04-09 (×5): qty 1

## 2015-04-09 NOTE — Progress Notes (Signed)
Speech Language Pathology Treatment: Dysphagia  Patient Details Name: Keith MouldsRobert Francis MRN: [id number] DOB: [birth year] Today's Date: 04/09/2015 Time: [id number] SLP Time Calculation (min) (ACUTE ONLY): 26 min  Assessment / Plan / Recommendation Clinical Impression  Diagnostic treatment complete with focus on readiness/tolerance of pos. Per RN, patient placed on dysphagia 1 diet, thin liquids by MD this am given significant improvement in level of alertness and ability to consume thin liquids, ensure at bedside . This afternoon however, patient lethargic, requiring mod-max cues to maintain adequate level of alertness for po intake. Decreased awareness of po bolus noted, requiring moderate-max tactile cueing (spoon to lips) for labial opening/oral acceptance of bolus, and moderate-max verbal cueing for initiation of swallow. S/s of aspiration characterized by wet vocal quality, throat clearing noted with both thin and nectar thick liquid trials, improving with pureed solids and honey thick liquids via controlled tsp. boluses. Dr. Vanessa BarbaraZamora present for treatment and confirmed significant increased in lethargy, decreased awareness this pm than this am, agreeable to diet change to include thickened liquids to mitigate risk of aspiration. Per family (note in chart), alertness generally better in am. Suspect that particularly with acute illness, level of alertness and mentation with fluctuate. Will downgrade diet to attempt to accomodate for fluctuations and decrease aspiration risk and f/u closely.    HPI HPI: 81 year old male with history of Parkinson disease, MI, CAD, cortical blindness, hypotention, CABG, dementia, resident of skilled nursing facility admitted for further evaluation of progressively worsening altered mental status. Per chart/family report pt is minimally verbal at baseline. Temp T101F on admission. Found to have UTI. Chest x-ray with no acute abnormalities, urinalysis with small leukocytes. CT No  intracranial hemorrhage or mass effect.   Pertinent Vitals Pain Assessment: Faces Faces Pain Scale: No hurt  SLP Plan  Goals updated    Recommendations Diet recommendations: Dysphagia 1 (puree);Honey-thick liquid Liquids provided via: Teaspoon Medication Administration: Crushed with puree Compensations: Slow rate;Small sips/bites Postural Changes and/or Swallow Maneuvers: Seated upright 90 degrees;Out of bed for meals              Oral Care Recommendations: Oral care BID Follow up Recommendations: Skilled Nursing facility Plan: Goals updated    GO    Ferdinand LangoLeah Anyelin Mogle MA, CCC-SLP [phone]  Ferdinand LangoMcCoy Keith Francis Keith Francis 04/09/2015, 3:20 PM

## 2015-04-09 NOTE — Progress Notes (Signed)
[facility] PROGRESS NOTE  Keith Francis ZOX:[id number] DOB: 21-Sep-1933 DOA: 04/07/2015 PCP: Rogelia Boga, MD  Assessment/Plan: 1. Sepsis -Present on admission, evidenced by temperature 11.7, heart rate of 101, acute encephalopathy, left 1 white count is 17,000. -Suspect source of infection to be urinary tract infection as urinalysis showed presence of bacteria -Blood culture showing no growth after overnight Patient -Pending urine cultures -Will continue 24 more hours of broad-spectrum IV antimicrobial therapy and consider narrowing his antimicrobial regimen in a.m. -A.m. labs showing a downward trend in his white blood cells from 17-12  2. Acute encephalopathy -Patient is a nursing home patient with a past medical history of Parkinson's disease and dementia, presenting with a significant flushing or decline likely precipitated by acute infectious process. -He is to be septic on admission and started on broad-spectrum IV antimicrobial therapy. -CT scan of brain that revealed acute changes -Continue supportive care -He was evaluated by speech pathology found to have evidence of aspiration, made nothing by mouth. -Will reassess tomorrow  3. Possible urinary tract infection. -Urinalysis showed the presence of bacteria. Continue IV antibiotic therapy, follow-up on cultures.  4. Parkinson's disease -Suspect he may have Parkinson's dementia. Will continue carbidopa/levodopa 1 tablet 4 times daily  5. 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 -Patient with advanced dementia, nursing resident, with history of Parkinson's disease. Continue carbidopa/levodopa. -He is on Seroquel 50 mg by mouth daily at bedtime -Patient having evidence of aspiration on swallow evaluation, making him nothing by mouth  Code Status: Full code Family Communication:  Disposition Plan:    Consultants:  Pathology   Antibiotics:  Vancomycin  Zosyn  HPI/Subjective: Patient is a 81 year old  woman with past medical history of Parkinson's disease, advanced dementia, currently resident at a skilled nursing facility was admitted to medicine service on 04/07/2015 when he presented with mental status changes, functional decline, failure to thrive. On arrival he was found to have a temperature 101 with heart rate 101, with lab work showing presence of urinary tract infection. He was started on broad-spectrum IV antibiotic therapy with Zosyn and vancomycin.   Objective: Filed Vitals:   04/08/15 0445  BP: 142/87  Pulse: 76  Temp: 99.9 F (37.7 C)  Resp:     Intake/Output Summary (Last 24 hours) at 04/08/15 1513 Last data filed at 04/08/15 1300  Gross per 24 hour  Intake 1773.75 ml  Output  701 ml  Net 1072.75 ml   Filed Weights   04/07/15 1121 04/07/15 1500 04/08/15 0445  Weight: 58.968 kg (130 lb) 58.968 kg (130 lb) 49.25 kg (108 lb 9.2 oz)    Exam:   General: Patient is confused, disoriented, minimally verbal, not following commands, chronically ill-appearing   Cardiovascular: Regular rate and rhythm normal S1-S2  Respiratory: Normal respiratory effort, lungs overall clear, coarse upper respiratory sounds  Abdomen: Soft nontender nondistended  Musculoskeletal: Significant bilateral muscle atrophy  Data Reviewed: Basic Metabolic Panel:  Last Labs      Recent Labs Lab 04/03/15 1900 04/07/15 1130 04/07/15 1702 04/08/15 0516  NA 145 148* 148* 149*  K 4.0 3.8 3.7 4.3  CL 111 114* 115* 118*  CO2 GLUCOSE 113* 100* 112* 95  BUN 40* 27* 24* 21  CREATININE 0.86 0.86 0.86 0.91  CALCIUM 8.8 9.2 8.4 8.5     Liver Function Tests:  Last Labs      Recent Labs Lab 04/03/15 1900 04/07/15 1130 04/07/15 1702  AST 30 33 27  ALT ALKPHOS 66  75 65  BILITOT 0.7 0.6 0.9  PROT 7.2 7.2 6.5  ALBUMIN 4.0 3.9 3.5      Last Labs     No  results for input(s): LIPASE, AMYLASE in the last 168 hours.    Last Labs     No results for input(s): AMMONIA in the last 168 hours.   CBC:  Last Labs      Recent Labs Lab 04/03/15 1900 04/07/15 1130 04/07/15 1702 04/08/15 0516  WBC 12.3* 15.5* 17.0* 12.0*  NEUTROABS --  13.8* 15.7* --   HGB 12.8* 14.2 12.9* 11.9*  HCT 41.0 45.1 41.0 37.9*  MCV 101.2* 102.3* 101.2* 102.2*  PLT 213 182 189 170     Cardiac Enzymes:  Last Labs     No results for input(s): CKTOTAL, CKMB, CKMBINDEX, TROPONINI in the last 168 hours.   BNP (last 3 results)  Recent Labs (within last 365 days)    No results for input(s): BNP in the last 8760 hours.    ProBNP (last 3 results)  Recent Labs (within last 365 days)    No results for input(s): PROBNP in the last 8760 hours.    CBG:  Last Labs     No results for input(s): GLUCAP in the last 168 hours.    Recent Results (from the past 240 hour(s))  Blood Culture (routine x 2) Status: None (Preliminary result)   Collection Time: 04/07/15 11:30 AM  Result Value Ref Range Status   Specimen Description BLOOD RIGHT ARM 4 ML IN [facility] BOTTLE  Final   Special Requests NONE  Final   Culture   Final     BLOOD CULTURE RECEIVED NO GROWTH TO DATE CULTURE WILL BE HELD FOR 5 DAYS BEFORE ISSUING A FINAL NEGATIVE REPORT Performed at [facility]    Report Status PENDING  Incomplete  Blood Culture (routine x 2) Status: None (Preliminary result)   Collection Time: 04/07/15 11:56 AM  Result Value Ref Range Status   Specimen Description BLOOD RIGHT HAND  Final   Special Requests BOTTLES DRAWN AEROBIC AND ANAEROBIC 3 CC EACH  Final   Culture   Final     BLOOD CULTURE RECEIVED NO GROWTH TO DATE CULTURE WILL BE HELD FOR 5 DAYS BEFORE ISSUING A FINAL NEGATIVE REPORT Performed at [facility]    Report Status PENDING  Incomplete    MRSA PCR Screening Status: None   Collection Time: 04/07/15 3:35 PM  Result Value Ref Range Status   MRSA by PCR NEGATIVE NEGATIVE Final    Comment:   The GeneXpert MRSA Assay (FDA approved for NASAL specimens only), is one component of a comprehensive MRSA colonization surveillance program. It is not intended to diagnose MRSA infection nor to guide or monitor treatment for MRSA infections.      Studies:  Imaging Results (Last 48 hours)    Ct Head Wo Contrast  04/07/2015 CLINICAL DATA: Altered mental status. Normal yesterday. EXAM: CT HEAD WITHOUT CONTRAST TECHNIQUE: Contiguous axial images were obtained from the base of the skull through the vertex without intravenous contrast. COMPARISON: 01/06/2015. FINDINGS: Diffusely enlarged ventricles and subarachnoid spaces. Patchy white matter low density in both cerebral hemispheres and both cerebellar hemispheres without significant change. Patchy low density is again demonstrated in the medulla, pons and midbrain. This appears more pronounced in the midbrain on the right today. No intracranial hemorrhage or mass lesion. Unremarkable bones and included paranasal sinuses. IMPRESSION: 1. Mildly progressive ischemic changes in the mid brain on the  right. 2. Stable chronic ischemic changes in both cerebral hemispheres, pons, medulla and both cerebellar hemispheres. 3. Stable atrophy. 4. No intracranial hemorrhage or mass effect. Electronically Signed By: Beckie SaltsSteven Reid M.D. On: 04/07/2015 12:28   Dg Chest Port 1 View  04/07/2015 CLINICAL DATA: Altered mental status EXAM: PORTABLE CHEST - 1 VIEW COMPARISON: 04/03/2015 FINDINGS: Cardiac shadow is within normal limits. Postoperative changes are again seen. The lungs are clear bilaterally. No bony abnormality is seen. IMPRESSION: No active disease. Electronically Signed By: Alcide CleverMark Lukens M.D. On: 04/07/2015 12:11     Scheduled Meds: . aspirin  81 mg Oral Daily  . atorvastatin 20 mg Oral q1800  . carbidopa-levodopa 1 tablet Oral 4 times per day  . enoxaparin (LOVENOX) injection 40 mg Subcutaneous Q24H  . feeding supplement (ENSURE ENLIVE) 237 mL Oral BID BM  . piperacillin-tazobactam (ZOSYN) IV 3.375 g Intravenous 3 times per day  . QUEtiapine 50 mg Oral QHS  . sodium chloride 3 mL Intravenous Q12H  . vancomycin 500 mg Intravenous Q12H  . vitamin C 500 mg Oral Daily   Continuous Infusions: . sodium chloride 75 mL/hr at 04/08/15 [id number]    Principal Problem:  Sepsis Active Problems:  PARKINSON'S DISEASE  History of cardiovascular disorder  Altered mental state    Time spent: 35 min    Jeralyn BennettZAMORA, Meighan Treto [facility] Pager [phone]. If 7PM-7AM, please contact night-coverage at [email or web address], password [email or web address] 04/08/2015, 3:13 PM  LOS: 1 day                  Routing History     Date/Time From To Method   04/08/2015 3:39 PM Jeralyn BennettEzequiel Honest Safranek, MD Gordy SaversPeter F Kwiatkowski, MD In [facility]

## 2015-04-09 NOTE — Progress Notes (Signed)
INITIAL NUTRITION ASSESSMENT  Pt meets criteria for SEVERE MALNUTRITION in the context of chronic illness as evidenced by moderate to severe muscle and fat wasting, 23 lbs weight loss (17.5% body weight) in 6 months.  DOCUMENTATION CODES Per approved criteria  -Severe malnutrition in the context of chronic illness   INTERVENTION: -Continue Ensure Enlive po BID, each supplement provides 350 kcal and 20 grams of protein -Continue Dysphagia 1, Thin liquids -RD to monitor for additional supplement needs  NUTRITION DIAGNOSIS: Malnutrition (severe) related to chronic illness as evidenced by moderate to severe muscle and fat wasting and 23 lbs weight loss (17.5% body weight) in 6 months.   Goal: Pt to meet >/= 90% estimated needs  Monitor:  Meal and supplement intakes, weight trends, I/O's, labs  Reason for Assessment: Consult, Malnutrition Screening Tool assessment  81 y.o. male  Admitting Dx: Sepsis  ASSESSMENT: This is a 81 year old male who has hx which includes dementia and Parkinson's disease. He is primary bed bound and minimally verbal. He resides at a facility/memory care unit.  Pt seen for consult. Pt was NPO until lunch time when MD did bedside swallow evaluation and advanced pt's diet to Dysphagia 1 with thin liquids. Visualized lunch tray with 100%.  No family in room at time of first or second visit. Pt was on [facility] at first first so returned to complete physical assessment.   Order already in place for Ensure Enlive BID. Unable to assess if pt is meeting needs at this time.   Nutrition Focused Physical Exam:  Subcutaneous Fat:  Orbital Region: mild/moderate depletion Upper Arm Region: moderate to severe depletion Thoracic and Lumbar Region: did not assess at this time  Muscle:  Temple Region: mild/moderate depletion Clavicle Bone Region: mild/moderate depletion Clavicle and Acromion Bone Region: severe depletion Scapular Bone Region: unable to assess Dorsal  Hand: mild/moderate depletion Patellar Region: severe depletion Anterior Thigh Region: severe depletion Posterior Calf Region: severe depletion  Edema: none present    Height: Ht Readings from Last 1 Encounters:  04/07/15  (1.778 m)    Weight: Wt Readings from Last 1 Encounters:  04/09/15 108 lb 0.4 oz (49 kg)    Ideal Body Weight: 166 lbs (75.45 kg)  % Ideal Body Weight: 65%  Wt Readings from Last 10 Encounters:  04/09/15 108 lb 0.4 oz (49 kg)  09/29/14 131 lb (59.421 kg)  08/09/14 131 lb (59.421 kg)  08/29/13 131 lb (59.421 kg)  03/23/13 135 lb (61.236 kg)  11/15/12 138 lb (62.596 kg)  09/30/11 137 lb (62.143 kg)  04/01/11 137 lb (62.143 kg)  09/12/10 135 lb (61.236 kg)  03/19/10 137 lb (62.143 kg)    Usual Body Weight: approximately 131 lbs (59 kg), per above chart  % Usual Body Weight: 82%  BMI:  Body mass index is 15.5 kg/(m^2).  Estimated Nutritional Needs: Kcal: 1300-1500  Protein: 60-80 grams Fluid: 2L/day  Skin: stage 1 sacral ulcer  Diet Order: DIET - DYS 1 Room service appropriate?: Yes; Fluid consistency:: Thin  EDUCATION NEEDS: -No education needs identified at this time   Intake/Output Summary (Last 24 hours) at 04/09/15 1324 Last data filed at 04/09/15 1200  Gross per 24 hour  Intake 1763.75 ml  Output    402 ml  Net 1361.75 ml    Last BM: PTA   Labs:   Recent Labs Lab 04/07/15 1702 04/08/15 0516 04/09/15 0509  NA 148* 149* 142  K 3.7 4.3 3.3*  CL 115* 118* 112  CO2 24 26 21   BUN 24* 21 19  CREATININE 0.86 0.91 0.78  CALCIUM 8.4 8.5 7.8*  MG  --   --  2.0  GLUCOSE 112* 95 78    CBG (last 3)  No results for input(s): GLUCAP in the last 72 hours.  Scheduled Meds: . amLODipine  5 mg Oral Daily  . amoxicillin-clavulanate  1 tablet Oral Q12H  . aspirin  81 mg Oral Daily  . atorvastatin  20 mg Oral q1800  . carbidopa-levodopa  1 tablet Oral 4 times per day  . enoxaparin (LOVENOX) injection  40 mg Subcutaneous  Q24H  . feeding supplement (ENSURE ENLIVE)  237 mL Oral BID BM  . QUEtiapine  50 mg Oral QHS  . sodium chloride  3 mL Intravenous Q12H  . vitamin C  500 mg Oral Daily    Continuous Infusions:   Past Medical History  Diagnosis Date  . CAD (coronary artery disease)     [place] [facility] stent to native Rt wth DES 2004 hx cardiogenic shock, IABP, myoview 2013 negative ischemia  . Myocardial infarction 2004  . Osteopenia   . Parkinson disease   . Hyperlipidemia   . Hypertension   . 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 Hypotension 02/2013    lisinopril stopped. + falls  . Dementia     Past Surgical History  Procedure Laterality Date  . Coronary artery bypass graft  1996    X 4  . Total knee arthroplasty      bil  . Carotid stent      x3  . Cardiac catheterization  06/20/03    occl VG-RCA, occl. VG-LCX, patent VG-DIAG, distal LIMA MOD DISEASE  EF 45%  . Coronary angioplasty with stent placement  07/31/03    DES TO prox, mid and distal RCA    Trenton GammonJessica Kennidee Heyne, RD, LDN Inpatient Clinical Dietitian Pager # [phone] After hours/weekend pager # [phone]

## 2015-04-09 NOTE — Progress Notes (Signed)
Pt had a 5 beat run of vtach. VSS. Pt asymptomatic. On Call NP notified and gave new orders. Will continue to monitor pt and carry out plan of care. Mardene CelesteAsaro, Loys Shugars I

## 2015-04-09 NOTE — Progress Notes (Signed)
[facility] PROGRESS NOTE  Keith MouldsRobert Francis WUJ:[id number] DOB: [birth year] DOA: 04/07/2015 PCP: Rogelia BogaKWIATKOWSKI,PETER FRANK, MD  Assessment/Plan: 1. Sepsis -Present on admission, evidenced by temperature 11.7, heart rate of 101, acute encephalopathy, left 1 white count is 17,000. -Repeat labs showing downward trend in WBC's to 7,800 from 12,00o on yesterday's labs -Clinically showing improvement -Blood cultures drawn on 04/07/2015 showing no growth to date.  -Urine cultures showing not growth -Will stop IV Vancomycin and Zosyn and transition him to oral augmentin.   2. Acute encephalopathy -Patient is a nursing home patient with a past medical history of Parkinson's disease and dementia, presenting with a significant flushing or decline likely precipitated by acute infectious process. -Patient was assisted out of bed to chair, appearing improved as diet was advanced to dysphagia 1.   3. Possible urinary tract infection. -Urinalysis showed the presence of bacteria.  -Urine cultures showing no growth, will transition to Augmentin   4. Parkinson's disease -Suspect he may have Parkinson's dementia. Will continue carbidopa/levodopa 1 tablet 4 times daily  5. 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 -Patient with advanced dementia, nursing resident, with history of Parkinson's disease. Continue carbidopa/levodopa. -He is on Seroquel 50 mg by mouth daily at bedtime   Code Status: Full code Family Communication:  Disposition Plan:    Consultants:  SpeechPathology   Antibiotics:  Vancomycin stopped on 04/09/2015  Zosyn stopped on 04/09/2015  HPI/Subjective: Patient is a 81 year old woman with past medical history of Parkinson's disease, advanced dementia, currently resident at a skilled nursing facility was admitted to medicine service on 04/07/2015 when he presented with mental status changes, functional decline, failure to thrive. On arrival he was found to have a temperature 101  with heart rate 101, with lab work showing presence of urinary tract infection. He was started on broad-spectrum IV antibiotic therapy with Zosyn and vancomycin.   Objective: Filed Vitals:   04/08/15 0445  BP: 142/87  Pulse: 76  Temp: 99.9 F (37.7 C)  Resp:     Intake/Output Summary (Last 24 hours) at 04/08/15 1513 Last data filed at 04/08/15 1300  Gross per 24 hour  Intake 1773.75 ml  Output  701 ml  Net 1072.75 ml   Filed Weights   04/07/15 1121 04/07/15 1500 04/08/15 0445  Weight: 58.968 kg (130 lb) 58.968 kg (130 lb) 49.25 kg (108 lb 9.2 oz)    Exam:   General: Patient was assisted out of bed to chair, did well with bedside swallow eval. Remains pleasantly confused, disoriented.    Cardiovascular: Regular rate and rhythm normal S1-S2  Respiratory: Normal respiratory effort, lungs overall clear  Abdomen: Soft nontender nondistended  Musculoskeletal: Significant bilateral muscle atrophy, cachectic.   Data Reviewed: Basic Metabolic Panel:  Last Labs      Recent Labs Lab 04/03/15 1900 04/07/15 1130 04/07/15 1702 04/08/15 0516  NA 145 148* 148* 149*  K 4.0 3.8 3.7 4.3  CL 111 114* 115* 118*  CO2 23 25 24 26   GLUCOSE 113* 100* 112* 95  BUN 40* 27* 24* 21  CREATININE 0.86 0.86 0.86 0.91  CALCIUM 8.8 9.2 8.4 8.5     Liver Function Tests:  Last Labs      Recent Labs Lab 04/03/15 1900 04/07/15 1130 04/07/15 1702  AST 30 33 27  ALT 6 8 15   ALKPHOS 66 75 65  BILITOT 0.7 0.6 0.9  PROT 7.2 7.2 6.5  ALBUMIN 4.0 3.9 3.5      Last Labs     No results for input(s): LIPASE,  AMYLASE in the last 168 hours.    Last Labs     No results for input(s): AMMONIA in the last 168 hours.   CBC:  Last Labs      Recent Labs Lab 04/03/15 1900 04/07/15 1130 04/07/15 1702 04/08/15 0516  WBC 12.3* 15.5* 17.0* 12.0*  NEUTROABS --  13.8*  15.7* --   HGB 12.8* 14.2 12.9* 11.9*  HCT 41.0 45.1 41.0 37.9*  MCV 101.2* 102.3* 101.2* 102.2*  PLT 213 182 189 170     Cardiac Enzymes:  Last Labs     No results for input(s): CKTOTAL, CKMB, CKMBINDEX, TROPONINI in the last 168 hours.   BNP (last 3 results)  Recent Labs (within last 365 days)    No results for input(s): BNP in the last 8760 hours.    ProBNP (last 3 results)  Recent Labs (within last 365 days)    No results for input(s): PROBNP in the last 8760 hours.    CBG:  Last Labs     No results for input(s): GLUCAP in the last 168 hours.    Recent Results (from the past 240 hour(s))  Blood Culture (routine x 2) Status: None (Preliminary result)   Collection Time: 04/07/15 11:30 AM  Result Value Ref Range Status   Specimen Description BLOOD RIGHT ARM 4 ML IN [facility] BOTTLE  Final   Special Requests NONE  Final   Culture   Final     BLOOD CULTURE RECEIVED NO GROWTH TO DATE CULTURE WILL BE HELD FOR 5 DAYS BEFORE ISSUING A FINAL NEGATIVE REPORT Performed at [facility]    Report Status PENDING  Incomplete  Blood Culture (routine x 2) Status: None (Preliminary result)   Collection Time: 04/07/15 11:56 AM  Result Value Ref Range Status   Specimen Description BLOOD RIGHT HAND  Final   Special Requests BOTTLES DRAWN AEROBIC AND ANAEROBIC 3 CC EACH  Final   Culture   Final     BLOOD CULTURE RECEIVED NO GROWTH TO DATE CULTURE WILL BE HELD FOR 5 DAYS BEFORE ISSUING A FINAL NEGATIVE REPORT Performed at [facility]    Report Status PENDING  Incomplete  MRSA PCR Screening Status: None   Collection Time: 04/07/15 3:35 PM  Result Value Ref Range Status   MRSA by PCR NEGATIVE NEGATIVE Final    Comment:   The GeneXpert MRSA Assay (FDA approved for NASAL specimens only), is one component of a comprehensive MRSA  colonization surveillance program. It is not intended to diagnose MRSA infection nor to guide or monitor treatment for MRSA infections.      Studies:  Imaging Results (Last 48 hours)    Ct Head Wo Contrast  04/07/2015 CLINICAL DATA: Altered mental status. Normal yesterday. EXAM: CT HEAD WITHOUT CONTRAST TECHNIQUE: Contiguous axial images were obtained from the base of the skull through the vertex without intravenous contrast. COMPARISON: 01/06/2015. FINDINGS: Diffusely enlarged ventricles and subarachnoid spaces. Patchy white matter low density in both cerebral hemispheres and both cerebellar hemispheres without significant change. Patchy low density is again demonstrated in the medulla, pons and midbrain. This appears more pronounced in the midbrain on the right today. No intracranial hemorrhage or mass lesion. Unremarkable bones and included paranasal sinuses. IMPRESSION: 1. Mildly progressive ischemic changes in the mid brain on the right. 2. Stable chronic ischemic changes in both cerebral hemispheres, pons, medulla and both cerebellar hemispheres. 3. Stable atrophy. 4. No intracranial hemorrhage or mass effect. Electronically Signed By: Beckie Salts M.D. On: 04/07/2015  12:28   Dg Chest Port 1 View  04/07/2015 CLINICAL DATA: Altered mental status EXAM: PORTABLE CHEST - 1 VIEW COMPARISON: 04/03/2015 FINDINGS: Cardiac shadow is within normal limits. Postoperative changes are again seen. The lungs are clear bilaterally. No bony abnormality is seen. IMPRESSION: No active disease. Electronically Signed By: Alcide Clever M.D. On: 04/07/2015 12:11     Scheduled Meds: . aspirin 81 mg Oral Daily  . atorvastatin 20 mg Oral q1800  . carbidopa-levodopa 1 tablet Oral 4 times per day  . enoxaparin (LOVENOX) injection 40 mg Subcutaneous Q24H  . feeding supplement (ENSURE ENLIVE) 237 mL Oral BID BM  . piperacillin-tazobactam (ZOSYN) IV  3.375 g Intravenous 3 times per day  . QUEtiapine 50 mg Oral QHS  . sodium chloride 3 mL Intravenous Q12H  . vancomycin 500 mg Intravenous Q12H  . vitamin C 500 mg Oral Daily   Continuous Infusions: . sodium chloride 75 mL/hr at 04/08/15 1610    Principal Problem:  Sepsis Active Problems:  PARKINSON'S DISEASE  History of cardiovascular disorder  Altered mental state    Time spent: 35 min    Jeralyn Bennett [facility] Pager [phone]. If 7PM-7AM, please contact night-coverage at [email or web address], password [facility] 04/08/2015, 3:13 PM  LOS: 1 day                  Routing History     Date/Time From To Method   04/08/2015 3:39 PM Jeralyn Bennett, MD Gordy Savers, MD In [facility]

## 2015-04-10 NOTE — Evaluation (Signed)
Physical Therapy Evaluation Patient Details Name: Keith Francis MRN: [id number] DOB: [birth year] Today's Date: 04/10/2015   History of Present Illness  81 yo male admitted with sepsis. Hx of Parkinson's, dementia. Pt is from ALF  Clinical Impression  On eval, pt required Max assist +2 for mobility-able to ambulate ~20 feet with RW. Minimal verbalization from pt. No family present during session. At this time, recommending SNF.     Follow Up Recommendations SNF;Supervision/Assistance - 24 hour    Equipment Recommendations  None recommended by PT    Recommendations for Other Services       Precautions / Restrictions Precautions Precautions: Fall Restrictions Weight Bearing Restrictions: No      Mobility  Bed Mobility Overal bed mobility: Needs Assistance Bed Mobility: Supine to Sit;Sit to Supine     Supine to sit: Max assist;+2 for physical assistance;+2 for safety/equipment;HOB elevated Sit to supine: Max assist;+2 for physical assistance;+2 for safety/equipment;HOB elevated   General bed mobility comments: Assist for trunk and bil LEs. Increased time. Utilized bedpad for positioning.   Transfers Overall transfer level: Needs assistance Equipment used: Rolling walker (2 wheeled) Transfers: Sit to/from [employer] Sit to Stand: Mod assist;+2 physical assistance;+2 safety/equipment Stand pivot transfers: Mod assist       General transfer comment: Assist to rise, stabilize, control descent. Multimodal cues for safety, technique, hand placement.   Ambulation/Gait Ambulation/Gait assistance: Mod assist;+2 safety/equipment Ambulation Distance (Feet): 20 Feet Assistive device: Rolling walker (2 wheeled) Gait Pattern/deviations: Festinating;Narrow base of support;Shuffle;Decreased stride length;Decreased step length - right;Decreased step length - left;Trunk flexed     General Gait Details: Assist to stabilize pt and maneuver with walker. Pt maintains  flexed posturing and keept walker too far ahead. Followed closely with recliner.   Stairs            Wheelchair Mobility    Modified Rankin (Stroke Patients Only)       Balance Overall balance assessment: Needs assistance;History of Falls         Standing balance support: Bilateral upper extremity supported;During functional activity Standing balance-Leahy Scale: Poor                               Pertinent Vitals/Pain Pain Assessment: No/denies pain    Home Living Family/patient expects to be discharged to:: Skilled nursing facility                      Prior Function           Comments: Pt unable to provide PLOF info     Hand Dominance        Extremity/Trunk Assessment   Upper Extremity Assessment: Generalized weakness           Lower Extremity Assessment: Generalized weakness      Cervical / Trunk Assessment: Kyphotic  Communication   Communication: Expressive difficulties  Cognition Arousal/Alertness: Lethargic Behavior During Therapy: Flat affect Overall Cognitive Status: No family/caregiver present to determine baseline cognitive functioning Area of Impairment: Following commands;Problem solving       Following Commands: Follows one step commands with increased time     Problem Solving: Slow processing;Decreased initiation;Difficulty sequencing;Requires verbal cues;Requires tactile cues      General Comments      Exercises        Assessment/Plan    PT Assessment Patient needs continued PT services  PT Diagnosis Difficulty walking;Abnormality of gait;Generalized weakness;Altered mental status  PT Problem List Decreased strength;Decreased activity tolerance;Decreased balance;Decreased mobility;Decreased cognition;Decreased knowledge of use of DME;Decreased safety awareness  PT Treatment Interventions DME instruction;Gait training;Functional mobility training;Therapeutic activities;Therapeutic  exercise;Patient/family education;Balance training   PT Goals (Current goals can be found in the Care Plan section) Acute Rehab PT Goals Patient Stated Goal: none stated PT Goal Formulation: Patient unable to participate in goal setting Time For Goal Achievement: 04/24/15 Potential to Achieve Goals: Fair    Frequency Min 3X/week   Barriers to discharge        Co-evaluation               End of Session Equipment Utilized During Treatment: Gait belt Activity Tolerance: Patient limited by lethargy;Patient limited by fatigue Patient left: in bed;with call bell/phone within reach;with bed alarm set           Time: 1424-1441 PT Time Calculation (min) (ACUTE ONLY): 17 min   Charges:   PT Evaluation $Initial PT Evaluation Tier I: 1 Procedure     PT G Codes:        Rebeca AlertJannie Rey Fors, MPT Pager: [phone]

## 2015-04-10 NOTE — Progress Notes (Signed)
Clinical Social Work Department CLINICAL SOCIAL WORK PLACEMENT NOTE 04/10/2015  Patient:  Keith Francis,Keith Francis  Account Number:  [id number] Admit date:  04/07/2015  Clinical Social Worker:  Orpah GreekKELLY FOLEY, LCSWA  Date/time:  04/10/2015 12:13 PM  Clinical Social Work is seeking post-discharge placement for this patient at the following level of care:   SKILLED NURSING   (*CSW will update this form in Epic as items are completed)   04/10/2015  Patient/family provided with [facility] Department of Clinical Social Work's list of facilities offering this level of care within the geographic area requested by the patient (or if unable, by the patient's family).  04/10/2015  Patient/family informed of their freedom to choose among providers that offer the needed level of care, that participate in Medicare, Medicaid or managed care program needed by the patient, have an available bed and are willing to accept the patient.  04/10/2015  Patient/family informed of [facility]' ownership interest in [facility], as well as of the fact that they are under no obligation to receive care at this facility.  PASARR submitted to EDS on 04/10/2015 PASARR number received on 04/10/2015  FL2 transmitted to all facilities in geographic area requested by pt/family on  04/10/2015 FL2 transmitted to all facilities within larger geographic area on   Patient informed that his/her managed care company has contracts with or will negotiate with  certain facilities, including the following:     Patient/family informed of bed offers received:   Patient chooses bed at  Physician recommends and patient chooses bed at    Patient to be transferred to  on   Patient to be transferred to facility by  Patient and family notified of transfer on  Name of family member notified:    The following physician request were entered in Epic:   Additional Comments:    Lincoln MaxinKelly Cella Cappello, LCSW [facility] Clinical Social Worker cell #: [phone]

## 2015-04-10 NOTE — Progress Notes (Signed)
Clinical Social Work Department BRIEF PSYCHOSOCIAL ASSESSMENT 04/10/2015  Patient:  Keith Francis,Keith Francis     Account Number:  [id number]     Admit date:  04/07/2015  Clinical Social Worker:  Orpah GreekFOLEY,Charonda Hefter, LCSWA  Date/Time:  04/10/2015 11:47 AM  Referred by:  Physician  Date Referred:  04/10/2015 Referred for  SNF Placement   Other Referral:   Interview type:  Family Other interview type:   patient's wife, Harriett Sineancy at bedside    PSYCHOSOCIAL DATA Living Status:  [facility] Level of care:  Assisted Living Primary support name:  Vevelyn Francoisancy Canino (wife) h#: [phone] c#: [phone] Primary support relationship to patient:  SPOUSE Degree of support available:   good    CURRENT CONCERNS Current Concerns  Post-Acute Placement   Other Concerns:    SOCIAL WORK ASSESSMENT / PLAN CSW received consult that patient was admitted from [address] ([facility]).   Assessment/plan status:  Information/Referral to [facility] Other assessment/ plan:   Information/referral to community resources:   CSW completed FL2 and [facility] - provided SNF list to wife & will follow-up with bed offers. Awaiting PT evaluation.    PATIENT'S/FAMILY'S RESPONSE TO PLAN OF CARE: CSW spoke with patient's wife at bedside re: discharge planning. [facility] and wife is concerned about him returning or going to a SNF. CSW spoke with Geraldine Contrasee ([facility] care coordinator) [facility] (ph#: [phone]) to confirm patient was able to feed himself & ambulate at baseline.          Lincoln MaxinKelly Maloree Uplinger, LCSW [facility] Clinical Social Worker cell #: [phone]

## 2015-04-10 NOTE — Progress Notes (Signed)
Informed by CMT, Keith Francis, patient had 15 beats of v-tach.  Patient is asymptomatic.  Vital signs are temp 97.9, blood pressure 125/62, pulse is 80, respirations 20, and oxygen saturation is 97% on room air.  Dr. Vanessa BarbaraZamora notified.  Will continue to monitor patient.

## 2015-04-10 NOTE — Progress Notes (Signed)
Speech Language Pathology Treatment: Dysphagia  Patient Details Name: Keith MouldsRobert Francis MRN: [id number] DOB: 10-Jun-1933 Today's Date: 04/10/2015 Time: [id number] SLP Time Calculation (min) (ACUTE ONLY): 30 min  Assessment / Plan / Recommendation Clinical Impression  Skilled intervention indicated to educate pt/spouse and to reinforce mitigation strategies/diet modifications.  Wife reports pt with cough prior to and after breakfast but NOT during.  RN reports pt with tolerance of pills with applesauce today.  SLP observed spouse feeding pt - maximal verbal cues needed for pt to conduct strong throat clearing to clear wet voice/reflexive weak throat clearing.  Question laryngeal infiltration or residuals.  Delayed swallow apparent across consistencies - using visual cues educated wife to assure pt swallows prior to providing more intake.  At this time, pt is afebrile and CXR was clear, therefore recommend continue diet with strict precautions.    Spouse states pt desires thin water, advised her that would recommend he be allowed thin water only between meals and after oral care.  MD, please order if you agree.  Advised spouse that SLP role is to mitigate aspiration, not prevent it with pt's whom have progressive neurological diseases *pt has Parkinson's and dementia.  She verbalized understanding to information.  Recommend follow up SLP at SNF for dysphagia management in hopes to advance diet with medical progression as wife denies pt having problems swallowing PTA.     HPI HPI: 81 year old male with history of Parkinson disease, MI, CAD, cortical blindness, hypotention, CABG, dementia, resident of skilled nursing facility admitted for further evaluation of progressively worsening altered mental status. Per chart/family report pt is minimally verbal at baseline. Temp T101F on admission. Found to have UTI. Chest x-ray with no acute abnormalities, urinalysis with small leukocytes. CT No intracranial  hemorrhage or mass effect.   Pertinent Vitals Pain Assessment: No/denies pain  SLP Plan  Continue with current plan of care    Recommendations Diet recommendations: Dysphagia 1 (puree);Honey-thick liquid (free water between meals after oral care) Liquids provided via: Teaspoon Medication Administration: Crushed with puree (whole if contraindicated to crush) Supervision: Full supervision/cueing for compensatory strategies Compensations: Slow rate;Small sips/bites Postural Changes and/or Swallow Maneuvers: Seated upright 90 degrees;Out of bed for meals (cue pt to swallow prn)              Oral Care Recommendations: Oral care BID Follow up Recommendations: Skilled Nursing facility Plan: Continue with current plan of care    GO     Donavan Burnetamara Ineta Sinning, MS [facility] SLP [phone]

## 2015-04-10 NOTE — Progress Notes (Signed)
[facility] PROGRESS NOTE  Keith Francis AOZ:[id number] DOB: Aug 30, 1933 DOA: 04/07/2015 PCP: Rogelia Boga, MD  Interim Summary Patient is a 81 year old woman with past medical history of Parkinson's disease, advanced dementia, currently resident at a skilled nursing facility was admitted to medicine service on 04/07/2015 when he presented with mental status changes, functional decline, failure to thrive. On arrival he was found to have a temperature 101 with heart rate 101, with lab work showing presence of urinary tract infection. He was started on broad-spectrum IV antibiotic therapy with Zosyn and vancomycin. Patient showing slow improvement, lab work showing downward trend in her white count from [phone] on 04/09/2015. He was transitioned to Augmentin 875 tablet by mouth twice a day, with discontinuation of IV antimicrobials. Physical therapy consulted, will likely require skilled nursing facility placement as he previously resided and an assisted-living facility.  Assessment/Plan: 1. Sepsis -Present on admission, evidenced by temperature 11.7, heart rate of 101, acute encephalopathy, left 1 white count is 17,000. -Clinically showing improvement -Blood cultures drawn on 04/07/2015 showing no growth to date.  -Urine cultures showing not growth -Continue Augmentin  2. Acute encephalopathy -Patient is a nursing home patient with a past medical history of Parkinson's disease and dementia, presenting with a significant flushing or decline likely precipitated by acute infectious process. -Showing slow improvement, tolerating by mouth today. -Likely require SNF placement  3. Possible urinary tract infection. -Urinalysis showed the presence of bacteria.  -Urine cultures showing no growth, will transition to Augmentin   4. Parkinson's disease -Suspect he may have Parkinson's dementia. Will continue carbidopa/levodopa 1 tablet 4 times daily  5. Probable Parkinson's  dementia. -Patient with advanced dementia, nursing resident, with history of Parkinson's disease. Continue carbidopa/levodopa. -He is on Seroquel 50 mg by mouth daily at bedtime   Code Status: Full code Family Communication:  Disposition Plan:    Consultants:  SpeechPathology   Antibiotics:  Vancomycin stopped on 04/09/2015  Zosyn stopped on 04/09/2015  HPI/Subjective: Patient is confused, disoriented, cannot provide history. Per nursing staff had a good breakfast and lunch.  Objective: Filed Vitals:   04/08/15 0445  BP: 142/87  Pulse: 76  Temp: 99.9 F (37.7 C)  Resp:     Intake/Output Summary (Last 24 hours) at 04/08/15 1513 Last data filed at 04/08/15 1300  Gross per 24 hour  Intake 1773.75 ml  Output  701 ml  Net 1072.75 ml   Filed Weights   04/07/15 1121 04/07/15 1500 04/08/15 0445  Weight: 58.968 kg (130 lb) 58.968 kg (130 lb) 49.25 kg (108 lb 9.2 oz)    Exam:   General:  Remains pleasantly confused, disoriented.    Cardiovascular: Regular rate and rhythm normal S1-S2  Respiratory: Normal respiratory effort, lungs overall clear  Abdomen: Soft nontender nondistended  Musculoskeletal: Significant bilateral muscle atrophy, cachectic.   Data Reviewed: Basic Metabolic Panel:  Last Labs      Recent Labs Lab 04/03/15 1900 04/07/15 1130 04/07/15 1702 04/08/15 0516  NA 145 148* 148* 149*  K 4.0 3.8 3.7 4.3  CL 111 114* 115* 118*  CO2 GLUCOSE 113* 100* 112* 95  BUN 40* 27* 24* 21  CREATININE 0.86 0.86 0.86 0.91  CALCIUM 8.8 9.2 8.4 8.5     Liver Function Tests:  Last Labs      Recent Labs Lab 04/03/15 1900 04/07/15 1130 04/07/15 1702  AST 30 33 27  ALT ALKPHOS 66 75 65  BILITOT 0.7 0.6 0.9  PROT 7.2 7.2 6.5  ALBUMIN 4.0 3.9 3.5      Last Labs     No results for input(s): LIPASE, AMYLASE in the last  168 hours.    Last Labs     No results for input(s): AMMONIA in the last 168 hours.   CBC:  Last Labs      Recent Labs Lab 04/03/15 1900 04/07/15 1130 04/07/15 1702 04/08/15 0516  WBC 12.3* 15.5* 17.0* 12.0*  NEUTROABS --  13.8* 15.7* --   HGB 12.8* 14.2 12.9* 11.9*  HCT 41.0 45.1 41.0 37.9*  MCV 101.2* 102.3* 101.2* 102.2*  PLT 213 182 189 170     Cardiac Enzymes:  Last Labs     No results for input(s): CKTOTAL, CKMB, CKMBINDEX, TROPONINI in the last 168 hours.   BNP (last 3 results)  Recent Labs (within last 365 days)    No results for input(s): BNP in the last 8760 hours.    ProBNP (last 3 results)  Recent Labs (within last 365 days)    No results for input(s): PROBNP in the last 8760 hours.    CBG:  Last Labs     No results for input(s): GLUCAP in the last 168 hours.    Recent Results (from the past 240 hour(s))  Blood Culture (routine x 2) Status: None (Preliminary result)   Collection Time: 04/07/15 11:30 AM  Result Value Ref Range Status   Specimen Description BLOOD RIGHT ARM 4 ML IN [facility] BOTTLE  Final   Special Requests NONE  Final   Culture   Final     BLOOD CULTURE RECEIVED NO GROWTH TO DATE CULTURE WILL BE HELD FOR 5 DAYS BEFORE ISSUING A FINAL NEGATIVE REPORT Performed at [facility]    Report Status PENDING  Incomplete  Blood Culture (routine x 2) Status: None (Preliminary result)   Collection Time: 04/07/15 11:56 AM  Result Value Ref Range Status   Specimen Description BLOOD RIGHT HAND  Final   Special Requests BOTTLES DRAWN AEROBIC AND ANAEROBIC 3 CC EACH  Final   Culture   Final     BLOOD CULTURE RECEIVED NO GROWTH TO DATE CULTURE WILL BE HELD FOR 5 DAYS BEFORE ISSUING A FINAL NEGATIVE REPORT Performed at [facility]    Report Status PENDING  Incomplete  MRSA PCR Screening Status: None    Collection Time: 04/07/15 3:35 PM  Result Value Ref Range Status   MRSA by PCR NEGATIVE NEGATIVE Final    Comment:   The GeneXpert MRSA Assay (FDA approved for NASAL specimens only), is one component of a comprehensive MRSA colonization surveillance program. It is not intended to diagnose MRSA infection nor to guide or monitor treatment for MRSA infections.      Studies:  Imaging Results (Last 48 hours)    Ct Head Wo Contrast  04/07/2015 CLINICAL DATA: Altered mental status. Normal yesterday. EXAM: CT HEAD WITHOUT CONTRAST TECHNIQUE: Contiguous axial images were obtained from the base of the skull through the vertex without intravenous contrast. COMPARISON: 01/06/2015. FINDINGS: Diffusely enlarged ventricles and subarachnoid spaces. Patchy white matter low density in both cerebral hemispheres and both cerebellar hemispheres without significant change. Patchy low density is again demonstrated in the medulla, pons and midbrain. This appears more pronounced in the midbrain on the right today. No intracranial hemorrhage or mass lesion. Unremarkable bones and included paranasal sinuses. IMPRESSION: 1. Mildly progressive ischemic changes in the mid brain on the right. 2. Stable chronic ischemic changes in both cerebral  hemispheres, pons, medulla and both cerebellar hemispheres. 3. Stable atrophy. 4. No intracranial hemorrhage or mass effect. Electronically Signed By: Beckie SaltsSteven Reid M.D. On: 04/07/2015 12:28   Dg Chest Port 1 View  04/07/2015 CLINICAL DATA: Altered mental status EXAM: PORTABLE CHEST - 1 VIEW COMPARISON: 04/03/2015 FINDINGS: Cardiac shadow is within normal limits. Postoperative changes are again seen. The lungs are clear bilaterally. No bony abnormality is seen. IMPRESSION: No active disease. Electronically Signed By: Alcide CleverMark Lukens M.D. On: 04/07/2015 12:11     Scheduled Meds: . aspirin 81 mg Oral Daily  . atorvastatin 20  mg Oral q1800  . carbidopa-levodopa 1 tablet Oral 4 times per day  . enoxaparin (LOVENOX) injection 40 mg Subcutaneous Q24H  . feeding supplement (ENSURE ENLIVE) 237 mL Oral BID BM  . piperacillin-tazobactam (ZOSYN) IV 3.375 g Intravenous 3 times per day  . QUEtiapine 50 mg Oral QHS  . sodium chloride 3 mL Intravenous Q12H  . vancomycin 500 mg Intravenous Q12H  . vitamin C 500 mg Oral Daily   Continuous Infusions: . sodium chloride 75 mL/hr at 04/08/15 [id number]    Principal Problem:  Sepsis Active Problems:  PARKINSON'S DISEASE  History of cardiovascular disorder  Altered mental state    Time spent: 30 min    Jeralyn BennettZAMORA, Yatzari Jonsson [facility] Pager [phone]. If 7PM-7AM, please contact night-coverage at [email or web address], password [email or web address] 04/08/2015, 3:13 PM  LOS: 1 day                  Routing History     Date/Time From To Method   04/08/2015 3:39 PM Jeralyn BennettEzequiel Adah Stoneberg, MD Gordy SaversPeter F Kwiatkowski, MD In [facility]Basket

## 2015-04-11 ENCOUNTER — Ambulatory Visit: Payer: No Typology Code available for payment source | Admitting: Cardiovascular Disease

## 2015-04-11 DIAGNOSIS — R4182 Altered mental status, unspecified: Secondary | ICD-10-CM

## 2015-04-11 LAB — BASIC METABOLIC PANEL
Anion gap: 7 (ref 5–15)
BUN: 22 mg/dL (ref 6–23)
CHLORIDE: 118 mmol/L — AB (ref 96–112)
CO2: 21 mmol/L (ref 19–32)
Calcium: 8.2 mg/dL — ABNORMAL LOW (ref 8.4–10.5)
Creatinine, Ser: 0.63 mg/dL (ref 0.50–1.35)
GFR calc Af Amer: >90 mL/min (ref >90)
GFR calc non Af Amer: >90 mL/min (ref >90)
Glucose, Bld: 88 mg/dL (ref 70–99)
Potassium: 3.7 mmol/L (ref 3.5–5.1)
Sodium: 146 mmol/L — ABNORMAL HIGH (ref 135–145)

## 2015-04-11 LAB — URINE CULTURE: Colony Count: >=100000

## 2015-04-11 LAB — CBC
HCT: 37.7 % — ABNORMAL LOW (ref 39.0–52.0)
Hemoglobin: 11.7 g/dL — ABNORMAL LOW (ref 13.0–17.0)
MCH: 31 pg (ref 26.0–34.0)
MCHC: 31 g/dL (ref 30.0–36.0)
MCV: 99.7 fL (ref 78.0–100.0)
PLATELETS: 186 10*3/uL (ref 150–400)
RBC: 3.78 MIL/uL — AB (ref 4.22–5.81)
RDW: 13.9 % (ref 11.5–15.5)
WBC: 5.5 10*3/uL (ref 4.0–10.5)

## 2015-04-11 MED ORDER — ENSURE ENLIVE PO LIQD: 237.0000 mL | Freq: Two times a day (BID) | ORAL | Status: DC

## 2015-04-11 MED ORDER — AMOXICILLIN-POT CLAVULANATE 875-125 MG PO TABS: 1.0000 | ORAL_TABLET | Freq: Two times a day (BID) | ORAL | Status: DC

## 2015-04-11 NOTE — Discharge Summary (Signed)
[facility]  Physician Discharge Summary   Patient ID: Keith Francis MRN: [id number] DOB/AGE: 1933-05-05 81 y.o.  Admit date: 04/07/2015 Discharge date: 04/11/2015  PCP: Rogelia Boga, MD  DISCHARGE DIAGNOSES:  Principal Problem:   Sepsis Active Problems:   PARKINSON'S DISEASE   History of cardiovascular disorder   Altered mental state   RECOMMENDATIONS FOR OUTPATIENT FOLLOW UP: 1. Patient being discharged to skilled nursing facility 2. Please continue to have speech pathologist follow-up for dysphagia 3. Please consider palliative medicine consultation for goals of care   DISCHARGE CONDITION: fair  Diet recommendation: Dysphagia 1 diet with honey thick liquids. Frasier water protocol.  Filed Weights   04/09/15 0421 04/10/15 0505 04/11/15 0500  Weight: 49 kg (108 lb 0.4 oz) 48.6 kg (107 lb 2.3 oz) 48.7 kg (107 lb 5.8 oz)    INITIAL HISTORY: Patient is a 81 year old woman with past medical history of Parkinson's disease, advanced dementia, resident at a assisted nursing facility was admitted to medicine service on 04/07/2015 when he presented with mental status changes, functional decline, failure to thrive. On arrival he was found to have a temperature 101 with heart rate 101, with lab work showing presence of urinary tract infection. He was started on broad-spectrum IV antibiotic therapy with Zosyn and vancomycin. Patient showing slow improvement, lab work showing downward trend in her white count from [phone] on 04/09/2015. He was transitioned to Augmentin 875 tablet by mouth twice a day, with discontinuation of IV antimicrobials.    HOSPITAL COURSE:   1. Sepsis This was present on admission, evidenced by temperature 11.7, heart rate of 101, acute encephalopathy, left 1 white count is 17,000. Patient was started on broad-spectrum antibiotics as mentioned above. He started improving. Blood cultures did not show any growth. Urine grew coag-negative  staph Aureus. He was changed over to Augmentin, which he has been tolerating. Initially there was a suspicion for urinary tract infection. Aspiration, also cannot be ruled out, although there was no clear-cut infiltrate on chest x-ray.  2. Acute encephalopathy Patient has a known history of Parkinson disease and dementia. He came in with worsening mental status due to acute infection. Now he is back to baseline.  3. urinary tract infection with coag negative staph Urinalysis showed the presence of bacteria. Urine cultures grew coagulase-negative staph 4 years.  4. Parkinson's disease Suspect he may have Parkinson's dementia. Will continue carbidopa/levodopa 1 tablet 4 times daily.   5. 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 Patient with advanced dementia with history of Parkinson's disease. Continue carbidopa/levodopa. He is on Seroquel 50 mg by mouth daily at bedtime. He was also on Exelon patch.   Discussed in detail with his wife today. She mentioned that he would not want to be resuscitated or put on life support if such a need arises. We will make him DO NOT RESUSCITATE. Patient may benefit from a palliative medicine consultation at the skilled nursing facility for goals of care. Will defer this to the skilled nursing facility.  Overall, stable for discharge today.  PERTINENT LABS:  The results of significant diagnostics from this hospitalization (including imaging, microbiology, ancillary and laboratory) are listed below for reference.    Microbiology: Recent Results (from the past 240 hour(s))  Urine culture     Status: None (Preliminary result)   Collection Time: 04/07/15 10:58 AM  Result Value Ref Range Status   Specimen Description URINE, CATHETERIZED  Final   Special Requests NONE  Final   Colony Count   Final    >=100,000 COLONIES/ML  Performed at [facility]   Final    STAPHYLOCOCCUS SPECIES (COAGULASE NEGATIVE) Note: RIFAMPIN AND GENTAMICIN SHOULD NOT  BE USED AS SINGLE DRUGS FOR TREATMENT OF STAPH INFECTIONS. Performed at [facility]    Report Status PENDING  Incomplete  Blood Culture (routine x 2)     Status: None (Preliminary result)   Collection Time: 04/07/15 11:30 AM  Result Value Ref Range Status   Specimen Description BLOOD RIGHT ARM  4 ML IN [facility] BOTTLE  Final   Special Requests NONE  Final   Culture   Final           BLOOD CULTURE RECEIVED NO GROWTH TO DATE CULTURE WILL BE HELD FOR 5 DAYS BEFORE ISSUING A FINAL NEGATIVE REPORT Performed at [facility]    Report Status PENDING  Incomplete  Blood Culture (routine x 2)     Status: None (Preliminary result)   Collection Time: 04/07/15 11:56 AM  Result Value Ref Range Status   Specimen Description BLOOD RIGHT HAND  Final   Special Requests BOTTLES DRAWN AEROBIC AND ANAEROBIC 3 CC EACH  Final   Culture   Final           BLOOD CULTURE RECEIVED NO GROWTH TO DATE CULTURE WILL BE HELD FOR 5 DAYS BEFORE ISSUING A FINAL NEGATIVE REPORT Performed at [facility]    Report Status PENDING  Incomplete  MRSA PCR Screening     Status: None   Collection Time: 04/07/15  3:35 PM  Result Value Ref Range Status   MRSA by PCR NEGATIVE NEGATIVE Final    Comment:        The GeneXpert MRSA Assay (FDA approved for NASAL specimens only), is one component of a comprehensive MRSA colonization surveillance program. It is not intended to diagnose MRSA infection nor to guide or monitor treatment for MRSA infections.   Urine culture     Status: None   Collection Time: 04/07/15  7:01 PM  Result Value Ref Range Status   Specimen Description URINE, CLEAN CATCH  Final   Special Requests NONE  Final   Colony Count NO GROWTH Performed at [facility]   Final   Culture NO GROWTH Performed at [facility]   Final   Report Status 04/09/2015 FINAL  Final     Labs: Basic Metabolic Panel:  Recent Labs Lab 04/07/15 1130 04/07/15 1702 04/08/15 0516  04/09/15 0509 04/11/15 0525  NA 148* 148* 149* 142 146*  K 3.8 3.7 4.3 3.3* 3.7  CL 114* 115* 118* 112 118*  CO2 GLUCOSE 100* 112* 95 78 88  BUN 27* 24* CREATININE 0.86 0.86 0.91 0.78 0.63  CALCIUM 9.2 8.4 8.5 7.8* 8.2*  MG  --   --   --  2.0  --    Liver Function Tests:  Recent Labs Lab 04/07/15 1130 04/07/15 1702  AST 33 27  ALT 8 15  ALKPHOS 75 65  BILITOT 0.6 0.9  PROT 7.2 6.5  ALBUMIN 3.9 3.5   CBC:  Recent Labs Lab 04/07/15 1130 04/07/15 1702 04/08/15 0516 04/09/15 0509 04/11/15 0525  WBC 15.5* 17.0* 12.0* 7.8 5.5  NEUTROABS 13.8* 15.7*  --   --   --   HGB 14.2 12.9* 11.9* 11.6* 11.7*  HCT 45.1 41.0 37.9* 36.5* 37.7*  MCV 102.3* 101.2* 102.2* 100.6* 99.7  PLT 182 189 170 160 186  IMAGING STUDIES Dg Chest 2 View (if Patient Has Fever And/or Copd)  04/03/2015   CLINICAL DATA:  Productive cough, diaphoresis  EXAM: CHEST  2 VIEW  COMPARISON:  01/10/2014  FINDINGS: Cardiomediastinal silhouette is stable. Status post CABG. No acute infiltrate or pleural effusion. No pulmonary edema. Degenerative changes bilateral shoulders. Stable degenerative changes thoracic spine.  IMPRESSION: No active cardiopulmonary disease.  No significant change.   Electronically Signed   By: Natasha Mead M.D.   On: 04/03/2015 20:19   Ct Head Wo Contrast  04/07/2015   CLINICAL DATA:  Altered mental status.  Normal yesterday.  EXAM: CT HEAD WITHOUT CONTRAST  TECHNIQUE: Contiguous axial images were obtained from the base of the skull through the vertex without intravenous contrast.  COMPARISON:  01/06/2015.  FINDINGS: Diffusely enlarged ventricles and subarachnoid spaces. Patchy white matter low density in both cerebral hemispheres and both cerebellar hemispheres without significant change. Patchy low density is again demonstrated in the medulla, pons and midbrain. This appears more pronounced in the midbrain on the right today. No intracranial hemorrhage or mass lesion.  Unremarkable bones and included paranasal sinuses.  IMPRESSION: 1. Mildly progressive ischemic changes in the mid brain on the right. 2. Stable chronic ischemic changes in both cerebral hemispheres, pons, medulla and both cerebellar hemispheres. 3. Stable atrophy. 4. No intracranial hemorrhage or mass effect.   Electronically Signed   By: Beckie Salts M.D.   On: 04/07/2015 12:28   Dg Chest Port 1 View  04/07/2015   CLINICAL DATA:  Altered mental status  EXAM: PORTABLE CHEST - 1 VIEW  COMPARISON:  04/03/2015  FINDINGS: Cardiac shadow is within normal limits. Postoperative changes are again seen. The lungs are clear bilaterally. No bony abnormality is seen.  IMPRESSION: No active disease.   Electronically Signed   By: Alcide Clever M.D.   On: 04/07/2015 12:11    DISCHARGE EXAMINATION: Filed Vitals:   04/10/15 1234 04/10/15 2340 04/11/15 0500 04/11/15 0646  BP: 125/62 134/80  119/99  Pulse: 80 85  84  Temp: 97.9 F (36.6 C) 98.1 F (36.7 C)  97.7 F (36.5 C)  TempSrc: Oral Axillary  Axillary  Resp: 20 20  19   Height:      Weight:   48.7 kg (107 lb 5.8 oz)   SpO2: 97% 98%  100%   General appearance: alert. Distracted. Resp: clear to auscultation bilaterally Cardio: regular rate and rhythm, S1, S2 normal, no murmur, click, rub or gallop GI: soft, non-tender; bowel sounds normal; no masses,  no organomegaly  DISPOSITION: SNF  Discharge Instructions    Discharge instructions    Complete by:  As directed   Dysphagia 1 diet with honey thick liquids and Frasier water protocol. Speech pathologist to follow at SNF. Please consider palliative medicine consult at SNF.    You were cared for by a hospitalist during your hospital stay. If you have any questions about your discharge medications or the care you received while you were in the hospital after you are discharged, you can call the unit and asked to speak with the hospitalist on call if the hospitalist that took care of you is not available.  Once you are discharged, your primary care physician will handle any further medical issues. Please note that NO REFILLS for any discharge medications will be authorized once you are discharged, as it is imperative that you return to your primary care physician (or establish a relationship with a primary care physician if you do not have  one) for your aftercare needs so that they can reassess your need for medications and monitor your lab values. If you do not have a primary care physician, you can call [phone] for a physician referral.     Increase activity slowly    Complete by:  As directed            ALLERGIES: No Known Allergies   Current Discharge Medication List    START taking these medications   Details  amoxicillin-clavulanate (AUGMENTIN) 875-125 MG per tablet Take 1 tablet by mouth every 12 (twelve) hours. For 6 more days.      CONTINUE these medications which have NOT CHANGED   Details  acetaminophen (TYLENOL) 325 MG tablet Take 2 tablets (650 mg total) by mouth every 12 (twelve) hours as needed.    Ascorbic Acid (VITAMIN C) 500 MG tablet Take 500 mg by mouth daily.      aspirin 81 MG chewable tablet Chew 81 mg by mouth daily.    atorvastatin (LIPITOR) 20 MG tablet Take 20 mg by mouth daily at 6 PM.     carbidopa-levodopa (SINEMET CR) 50-200 MG per tablet Take 1 tablet by mouth 4 (four) times daily. Take 1 Tablet at 7 am, Take 1 Tablet at 12 pm , Take 1 Tablet at 4 pm, Take 1 Tablet at 8 pm.    ENSURE PLUS (ENSURE PLUS) LIQD Take 237 mLs by mouth 3 (three) times daily between meals.    Protein POWD Take 2 scoop by mouth 3 (three) times daily. Mix with 4oz of milk or juice    QUEtiapine (SEROQUEL) 25 MG tablet Take 25 mg by mouth at bedtime. Take two tablets at night    rivastigmine (EXELON) 4.5 MG capsule Take 4.5 mg by mouth 2 (two) times daily.       Follow-up Information    Follow up with Rogelia BogaKWIATKOWSKI,PETER FRANK, MD. Schedule an appointment as soon as possible  for a visit in 2 weeks.   Specialty:  Internal Medicine   Contact information:   [address] [place] [id number] [phone]       TOTAL DISCHARGE TIME: 35 minutes  [facility] LLCKRISHNAN,Mishael Krysiak  [facility] Pager [phone]  04/11/2015, 12:56 PM

## 2015-04-11 NOTE — Progress Notes (Signed)
[facility]

## 2015-04-11 NOTE — Progress Notes (Signed)
Speech Language Pathology Treatment: Dysphagia  Patient Details Name: Keith Francis MRN: [id number] DOB: 1933/06/21 Today's Date: 04/11/2015 Time: 0940-1000 SLP Time Calculation (min) (ACUTE ONLY): 20 min  Assessment / Plan / Recommendation Clinical Impression  Pt sitting upright in bed, much more alert today with clear vocal quality.  Provided pt with toothbrush and paste and helped him hand over hand to brush his dentition.  Reviewed with spouse importance of oral care to decrease bacterial build up.   Provided pt with thin water after oral care, prolonged delayed swallow noted with pt requiring verbal cues to swallow.  Cough noted x2 with 3 boluses of water - likely aspiration.  At that time, SLP ceased water intake.  Recommend to continue [employer] protocol to maximize fluid intake due to pt's restrictive liquid diet.  Wife provided with written material and provided opportunities to answer questions.    Md please order follow up SLP at SNF for dysphagia management to allow pt's diet to be advanced as appropriate.  Thanks.    HPI HPI: 81 year old male with history of Parkinson disease, MI, CAD, cortical blindness, hypotention, CABG, dementia, resident of skilled nursing facility admitted for further evaluation of progressively worsening altered mental status. Per chart/family report pt is minimally verbal at baseline. Temp T101F on admission. Found to have UTI. Chest x-ray with no acute abnormalities, urinalysis with small leukocytes. CT No intracranial hemorrhage or mass effect.   Pertinent Vitals Pain Assessment: No/denies pain  SLP Plan  All goals met;Other (Comment) (further tx may be managed at next venue of care)    Recommendations Diet recommendations: Dysphagia 1 (puree);Honey-thick liquid (Frazier Water Protocol) Liquids provided via: Teaspoon;Cup Medication Administration: Crushed with puree (whole if contranindicated to crush) Supervision: Full supervision/cueing for  compensatory strategies Compensations: Slow rate;Small sips/bites (check for oral residuals) Postural Changes and/or Swallow Maneuvers: Seated upright 90 degrees (cue pt to swallow prn)              Oral Care Recommendations: Oral care BID Follow up Recommendations: Skilled Nursing facility Plan: All goals met;Other (Comment) (further tx may be managed at next venue of care)    [place], [facility] SLP [phone]

## 2015-04-13 LAB — CULTURE, BLOOD (ROUTINE X 2)
CULTURE: NO GROWTH
Culture: NO GROWTH

## 2015-05-11 ENCOUNTER — Encounter (HOSPITAL_COMMUNITY): Payer: Self-pay | Admitting: *Deleted

## 2015-05-11 ENCOUNTER — Inpatient Hospital Stay (HOSPITAL_COMMUNITY)
Admission: EM | Admit: 2015-05-11 | Discharge: 2015-05-15 | DRG: 469 | Disposition: A | Discharge status: Skilled Nursing Facility | Payer: Medicare Other | Attending: Internal Medicine | Admitting: Internal Medicine

## 2015-05-11 ENCOUNTER — Emergency Department (HOSPITAL_COMMUNITY): Payer: Medicare Other

## 2015-05-11 DIAGNOSIS — Z681 Body mass index (BMI) 19 or less, adult: Secondary | ICD-10-CM | POA: Diagnosis not present

## 2015-05-11 DIAGNOSIS — Z951 Presence of aortocoronary bypass graft: Secondary | ICD-10-CM

## 2015-05-11 DIAGNOSIS — W19XXXD Unspecified fall, subsequent encounter: Secondary | ICD-10-CM | POA: Diagnosis not present

## 2015-05-11 DIAGNOSIS — I2510 Atherosclerotic heart disease of native coronary artery without angina pectoris: Secondary | ICD-10-CM | POA: Diagnosis present

## 2015-05-11 DIAGNOSIS — F0280 Dementia in other diseases classified elsewhere without behavioral disturbance: Secondary | ICD-10-CM | POA: Diagnosis present

## 2015-05-11 DIAGNOSIS — D62 Acute posthemorrhagic anemia: Secondary | ICD-10-CM | POA: Diagnosis not present

## 2015-05-11 DIAGNOSIS — E785 Hyperlipidemia, unspecified: Secondary | ICD-10-CM | POA: Diagnosis present

## 2015-05-11 DIAGNOSIS — G20A1 Parkinson's disease without dyskinesia, without mention of fluctuations: Secondary | ICD-10-CM | POA: Diagnosis present

## 2015-05-11 DIAGNOSIS — S72012A Unspecified intracapsular fracture of left femur, initial encounter for closed fracture: Principal | ICD-10-CM | POA: Diagnosis present

## 2015-05-11 DIAGNOSIS — D72829 Elevated white blood cell count, unspecified: Secondary | ICD-10-CM | POA: Insufficient documentation

## 2015-05-11 DIAGNOSIS — I252 Old myocardial infarction: Secondary | ICD-10-CM | POA: Diagnosis not present

## 2015-05-11 DIAGNOSIS — W19XXXA Unspecified fall, initial encounter: Secondary | ICD-10-CM | POA: Insufficient documentation

## 2015-05-11 DIAGNOSIS — E43 Unspecified severe protein-calorie malnutrition: Secondary | ICD-10-CM | POA: Diagnosis present

## 2015-05-11 DIAGNOSIS — M25552 Pain in left hip: Secondary | ICD-10-CM | POA: Diagnosis present

## 2015-05-11 DIAGNOSIS — Y92129 Unspecified place in nursing home as the place of occurrence of the external cause: Secondary | ICD-10-CM | POA: Diagnosis not present

## 2015-05-11 DIAGNOSIS — Z66 Do not resuscitate: Secondary | ICD-10-CM | POA: Diagnosis present

## 2015-05-11 DIAGNOSIS — I10 Essential (primary) hypertension: Secondary | ICD-10-CM | POA: Diagnosis present

## 2015-05-11 DIAGNOSIS — S72002A Fracture of unspecified part of neck of left femur, initial encounter for closed fracture: Secondary | ICD-10-CM | POA: Diagnosis not present

## 2015-05-11 DIAGNOSIS — M8580 Other specified disorders of bone density and structure, unspecified site: Secondary | ICD-10-CM | POA: Diagnosis present

## 2015-05-11 DIAGNOSIS — G20 Parkinson's disease: Secondary | ICD-10-CM | POA: Diagnosis present

## 2015-05-11 DIAGNOSIS — Z96649 Presence of unspecified artificial hip joint: Secondary | ICD-10-CM

## 2015-05-11 LAB — COMPREHENSIVE METABOLIC PANEL
ALBUMIN: 3.2 g/dL — AB (ref 3.5–5.0)
ALK PHOS: 54 U/L (ref 38–126)
ALT: <5 U/L — ABNORMAL LOW (ref 17–63)
AST: 19 U/L (ref 15–41)
Anion gap: 9 (ref 5–15)
BILIRUBIN TOTAL: 0.5 mg/dL (ref 0.3–1.2)
BUN: 26 mg/dL — AB (ref 6–20)
CHLORIDE: 105 mmol/L (ref 101–111)
CO2: 26 mmol/L (ref 22–32)
Calcium: 8.5 mg/dL — ABNORMAL LOW (ref 8.9–10.3)
Creatinine, Ser: 0.83 mg/dL (ref 0.61–1.24)
GFR calc Af Amer: >60 mL/min (ref >60)
GFR calc non Af Amer: >60 mL/min (ref >60)
Glucose, Bld: 102 mg/dL — ABNORMAL HIGH (ref 65–99)
POTASSIUM: 3.7 mmol/L (ref 3.5–5.1)
Sodium: 140 mmol/L (ref 135–145)
Total Protein: 6.3 g/dL — ABNORMAL LOW (ref 6.5–8.1)

## 2015-05-11 LAB — CBC WITH DIFFERENTIAL/PLATELET
BASOS ABS: 0 10*3/uL (ref 0.0–0.1)
Basophils Relative: 0 % (ref 0–1)
Eosinophils Absolute: 0.2 10*3/uL (ref 0.0–0.7)
Eosinophils Relative: 2 % (ref 0–5)
HEMATOCRIT: 35.9 % — AB (ref 39.0–52.0)
HEMOGLOBIN: 11.5 g/dL — AB (ref 13.0–17.0)
LYMPHS ABS: 1.1 10*3/uL (ref 0.7–4.0)
Lymphocytes Relative: 9 % — ABNORMAL LOW (ref 12–46)
MCH: 31 pg (ref 26.0–34.0)
MCHC: 32 g/dL (ref 30.0–36.0)
MCV: 96.8 fL (ref 78.0–100.0)
MONOS PCT: 7 % (ref 3–12)
Monocytes Absolute: 0.9 10*3/uL (ref 0.1–1.0)
Neutro Abs: 9.8 10*3/uL — ABNORMAL HIGH (ref 1.7–7.7)
Neutrophils Relative %: 82 % — ABNORMAL HIGH (ref 43–77)
Platelets: 172 10*3/uL (ref 150–400)
RBC: 3.71 MIL/uL — AB (ref 4.22–5.81)
RDW: 13.5 % (ref 11.5–15.5)
WBC: 12.1 10*3/uL — AB (ref 4.0–10.5)

## 2015-05-11 LAB — TYPE AND SCREEN
ABO/RH(D): A POS
ANTIBODY SCREEN: NEGATIVE

## 2015-05-11 LAB — PROTIME-INR
INR: 1.03 (ref 0.00–1.49)
PROTHROMBIN TIME: 13.7 s (ref 11.6–15.2)

## 2015-05-11 LAB — ABO/RH: ABO/RH(D): A POS

## 2015-05-11 MED ORDER — ASPIRIN EC 325 MG PO TBEC: 325.0000 mg | DELAYED_RELEASE_TABLET | Freq: Every day | ORAL | Status: DC

## 2015-05-11 MED ORDER — ATORVASTATIN CALCIUM 10 MG PO TABS
20.0000 mg | ORAL_TABLET | Freq: Every day | ORAL | Status: DC
  Administered 2015-05-13 – 2015-05-14 (×2): 20 mg via ORAL
  Filled 2015-05-11 (×2): qty 2

## 2015-05-11 MED ORDER — CARBIDOPA-LEVODOPA ER 50-200 MG PO TBCR
1.0000 | EXTENDED_RELEASE_TABLET | ORAL | Status: DC
  Administered 2015-05-12 – 2015-05-15 (×10): 1 via ORAL
  Filled 2015-05-11 (×19): qty 1

## 2015-05-11 MED ORDER — RIVASTIGMINE TARTRATE 3 MG PO CAPS
3.0000 mg | ORAL_CAPSULE | Freq: Two times a day (BID) | ORAL | Status: DC
  Administered 2015-05-12 – 2015-05-15 (×7): 3 mg via ORAL
  Filled 2015-05-11 (×10): qty 1

## 2015-05-11 MED ORDER — HYDROCODONE-ACETAMINOPHEN 5-325 MG PO TABS
1.0000 | ORAL_TABLET | Freq: Four times a day (QID) | ORAL | Status: DC | PRN
  Administered 2015-05-13 – 2015-05-14 (×4): 1 via ORAL
  Filled 2015-05-11 (×5): qty 1

## 2015-05-11 MED ORDER — MORPHINE SULFATE 2 MG/ML IJ SOLN
0.5000 mg | INTRAMUSCULAR | Status: DC | PRN
  Administered 2015-05-12 – 2015-05-13 (×4): 0.5 mg via INTRAVENOUS
  Filled 2015-05-11 (×4): qty 1

## 2015-05-11 NOTE — ED Notes (Signed)
[facility]/17. Pt was not evaluated at that time, today began to c/o right hip pain, xrays were obtained and pt sent here due to left hip fx. Pt alert and oriented per his norm, history of dementia, no distress noted

## 2015-05-11 NOTE — H&P (Signed)
[facility] History and Physical  Keith Francis ZOX:[id number] DOB: 11-25-1933 DOA: 05/11/2015  Referring physician: EDP PCP: Rogelia Boga, MD   Chief Complaint: Hip injury   HPI: Keith Francis is a 81 y.o. male brought in from his SNF by EMS after x ray at the SNF showed L hip fracture.  Fall apparently was 3 days ago on 5/17.  Began to complain of hip pain today.  X rays performed at [facility] demonstrating left subcapital femoral neck fracture.  Review of Systems: unable to perform due to dementia  Past Medical History  Diagnosis Date  . CAD (coronary artery disease)     [place], stent to native Rt wth DES 2004 hx cardiogenic shock, IABP, myoview 2013 negative ischemia  . Myocardial infarction 2004  . Osteopenia   . Parkinson disease   . Hyperlipidemia   . Hypertension   . 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 Hypotension 02/2013    lisinopril stopped. + falls  . Dementia    Past Surgical History  Procedure Laterality Date  . Coronary artery bypass graft  1996    X 4  . Total knee arthroplasty      bil  . Carotid stent      x3  . Cardiac catheterization  06/20/03    occl VG-RCA, occl. VG-LCX, patent VG-DIAG, distal LIMA MOD DISEASE  EF 45%  . Coronary angioplasty with stent placement  07/31/03    DES TO prox, mid and distal RCA   Social History:  reports that he has never smoked. He has never used smokeless tobacco. He reports that he drinks alcohol. He reports that he does not use illicit drugs.  No Known Allergies  Family History  Problem Relation Age of Onset  . Heart disease Mother   . Hypertension Mother   . Arthritis Mother   . Heart disease Father   . Kidney disease Father     renal failure  . Heart disease Sister   . Lung disease Brother      Prior to Admission medications   Medication Sig Start Date End Date Taking? Authorizing Provider  acetaminophen (TYLENOL) 325 MG tablet Take 2 tablets (650 mg total) by mouth  every 12 (twelve) hours as needed. Patient taking differently: Take 650 mg by mouth every 12 (twelve) hours as needed for mild pain (pain).  12/04/14  Yes Doe-Hyun Sherran Needs, DO  Alum & Mag Hydroxide-Simeth (MAGIC MOUTHWASH) SOLN Take 5 mLs by mouth 3 (three) times daily.   Yes Historical Provider, MD  Amino Acids-Protein Hydrolys (FEEDING SUPPLEMENT, PRO-STAT SUGAR FREE 64,) LIQD Take 30 mLs by mouth 3 (three) times daily with meals.   Yes Historical Provider, MD  Ascorbic Acid (VITAMIN C) 500 MG tablet Take 500 mg by mouth daily.     Yes Historical Provider, MD  aspirin 81 MG chewable tablet Chew 81 mg by mouth daily.   Yes Historical Provider, MD  atorvastatin (LIPITOR) 20 MG tablet Take 20 mg by mouth daily at 6 PM.  04/05/15  Yes Historical Provider, MD  carbidopa-levodopa (SINEMET CR) 50-200 MG per tablet Take 1 tablet by mouth 4 (four) times daily. Take 1 Tablet at 8 am, Take 1 Tablet at 12 pm , Take 1 Tablet at 4 pm, Take 1 Tablet at 8 pm.   Yes Historical Provider, MD  rivastigmine (EXELON) 3 MG capsule Take 3 mg by mouth 2 (two) times daily.   Yes Historical Provider, MD  Physical Exam: Filed Vitals:   05/11/15 2100  BP: 139/88  Pulse: 86  Temp:   Resp: 20    BP 139/88 mmHg  Pulse 86  Temp(Src) 98.9 F (37.2 C) (Oral)  Resp 20  SpO2 100%  General Appearance:    Alert, disoriented, no distress, appears stated age  Head:    Normocephalic, atraumatic  Eyes:    PERRL, EOMI, sclera non-icteric        Nose:   Nares without drainage or epistaxis. Mucosa, turbinates normal  Throat:   Moist mucous membranes. Oropharynx without erythema or exudate.  Neck:   Supple. No carotid bruits.  No thyromegaly.  No lymphadenopathy.   Back:     No CVA tenderness, no spinal tenderness  Lungs:     Clear to auscultation bilaterally, without wheezes, rhonchi or rales  Chest wall:    No tenderness to palpitation  Heart:    Regular rate and rhythm without murmurs, gallops, rubs  Abdomen:     Soft,  non-tender, nondistended, normal bowel sounds, no organomegaly  Genitalia:    deferred  Rectal:    deferred  Extremities:   Decreased ROM and tenderness of the left hip.  Pulses:   2+ and symmetric all extremities  Skin:   Skin color, texture, turgor normal, no rashes or lesions  Lymph nodes:   Cervical, supraclavicular, and axillary nodes normal  Neurologic:   CNII-XII intact. Normal strength, sensation and reflexes      throughout    Labs on Admission:  Basic Metabolic Panel:  Recent Labs Lab 05/11/15 2046  NA 140  K 3.7  CL 105  CO2 26  GLUCOSE 102*  BUN 26*  CREATININE 0.83  CALCIUM 8.5*   Liver Function Tests:  Recent Labs Lab 05/11/15 2046  AST 19  ALT <5*  ALKPHOS 54  BILITOT 0.5  PROT 6.3*  ALBUMIN 3.2*   No results for input(s): LIPASE, AMYLASE in the last 168 hours. No results for input(s): AMMONIA in the last 168 hours. CBC:  Recent Labs Lab 05/11/15 2046  WBC 12.1*  NEUTROABS 9.8*  HGB 11.5*  HCT 35.9*  MCV 96.8  PLT 172   Cardiac Enzymes: No results for input(s): CKTOTAL, CKMB, CKMBINDEX, TROPONINI in the last 168 hours.  BNP (last 3 results) No results for input(s): PROBNP in the last 8760 hours. CBG: No results for input(s): GLUCAP in the last 168 hours.  Radiological Exams on Admission: Dg Hip Unilat With Pelvis 2-3 Views Left  05/11/2015   CLINICAL DATA:  fall on 5/17. Pt was not evaluated at that time, today began to c/o right hip pain  EXAM: LEFT HIP (WITH PELVIS) 2-3 VIEWS  COMPARISON:  None.  FINDINGS: Subcapital left femoral neck fracture, displaced at least 12 mm. Bony pelvis intact. Advanced degenerative changes in the visualized lower lumbar spine.  IMPRESSION: 1. Left subcapital femoral neck fracture.   Electronically Signed   By: Corlis Leak  Hassell M.D.   On: 05/11/2015 20:53    EKG: Independently reviewed.  Assessment/Plan Principal Problem:   Closed left hip fracture Active Problems:   PARKINSON'S DISEASE   Essential  hypertension   1. Closed left hip fracture - 1. Dr. Roda ShuttersXu [facility].  He spoke with wife on phone who wishes to proceed with transfer and surgery tomorrow. 2. Hip fracture pathway 3. Pain control per pathway 4. NPO 5. ASA / SCDs for DVT ppx as per protocol 2. Parkinson's disease - 1.  Continue sinamet 2. Continue rivastigmine 3. HTN - on his history, but dont see that he is actually on any meds for this or requires any meds at home for control of this. 4. HLD - continue statin.    Code Status: DNR  Family Communication: Family at bedside Disposition Plan: Admit to inpatient   Time spent: 70 min  GARDNER, JARED M. [facility] Pager [phone]  If 7AM-7PM, please contact the day team taking care of the patient [email or web address] Password [email or web address] 05/11/2015, 10:23 PM

## 2015-05-11 NOTE — ED Notes (Signed)
Nurse starting IV 

## 2015-05-11 NOTE — ED Notes (Signed)
MD at bedside updating family.  

## 2015-05-11 NOTE — ED Provider Notes (Signed)
CSN: [id number]     Arrival date & time 05/11/15  2002 History   First MD Initiated Contact with Patient 05/11/15 2017     Chief Complaint  Patient presents with  . Hip Injury     (Consider location/radiation/quality/duration/timing/severity/associated sxs/prior Treatment) HPI  81 year old male [facility] after an xray showed a left hip fracture. He apparently fell on 5/17 and today began to complain of right hip pain. On the pelvis x-ray it was shown that he has a left hip fracture. Patient is at his normal mental baseline and no other injuries noted. Patient is demented and unable to provide a history.  Past Medical History  Diagnosis Date  . CAD (coronary artery disease)     [place], stent to native Rt wth DES 2004 hx cardiogenic shock, IABP, myoview 2013 negative ischemia  . Myocardial infarction 2004  . Osteopenia   . Parkinson disease   . Hyperlipidemia   . Hypertension   . 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 Hypotension 02/2013    lisinopril stopped. + falls  . Dementia    Past Surgical History  Procedure Laterality Date  . Coronary artery bypass graft  1996    X 4  . Total knee arthroplasty      bil  . Carotid stent      x3  . Cardiac catheterization  06/20/03    occl VG-RCA, occl. VG-LCX, patent VG-DIAG, distal LIMA MOD DISEASE  EF 45%  . Coronary angioplasty with stent placement  07/31/03    DES TO prox, mid and distal RCA   Family History  Problem Relation Age of Onset  . Heart disease Mother   . Hypertension Mother   . Arthritis Mother   . Heart disease Father   . Kidney disease Father     renal failure  . Heart disease Sister   . Lung disease Brother    History  Substance Use Topics  . Smoking status: Never Smoker   . Smokeless tobacco: Never Used  . Alcohol Use: Yes    Review of Systems  Unable to perform ROS: Dementia      Allergies  Review of patient's allergies indicates no known  allergies.  Home Medications   Prior to Admission medications   Medication Sig Start Date End Date Taking? Authorizing Provider  acetaminophen (TYLENOL) 325 MG tablet Take 2 tablets (650 mg total) by mouth every 12 (twelve) hours as needed. Patient taking differently: Take 650 mg by mouth every 12 (twelve) hours as needed for mild pain.  12/04/14   Doe-Hyun R Artist PaisYoo, DO  amoxicillin-clavulanate (AUGMENTIN) 875-125 MG per tablet Take 1 tablet by mouth every 12 (twelve) hours. For 6 more days. 04/11/15   Osvaldo ShipperGokul Krishnan, MD  Ascorbic Acid (VITAMIN C) 500 MG tablet Take 500 mg by mouth daily.      Historical Provider, MD  aspirin 81 MG chewable tablet Chew 81 mg by mouth daily.    Historical Provider, MD  atorvastatin (LIPITOR) 20 MG tablet Take 20 mg by mouth daily at 6 PM.  04/05/15   Historical Provider, MD  carbidopa-levodopa (SINEMET CR) 50-200 MG per tablet Take 1 tablet by mouth 4 (four) times daily. Take 1 Tablet at 7 am, Take 1 Tablet at 12 pm , Take 1 Tablet at 4 pm, Take 1 Tablet at 8 pm.    Historical Provider, MD  ENSURE PLUS (ENSURE PLUS) LIQD Take 237 mLs by  mouth 3 (three) times daily between meals.    Historical Provider, MD  Protein POWD Take 2 scoop by mouth 3 (three) times daily. Mix with 4oz of milk or juice    Historical Provider, MD  QUEtiapine (SEROQUEL) 25 MG tablet Take 25 mg by mouth at bedtime. Take two tablets at night    Historical Provider, MD  rivastigmine (EXELON) 4.5 MG capsule Take 4.5 mg by mouth 2 (two) times daily.    Historical Provider, MD   BP 108/69 mmHg  Pulse 86  Temp(Src) 98.9 F (37.2 C) (Oral)  Resp 15  SpO2 100% Physical Exam  Constitutional: He appears well-developed and well-nourished.  HENT:  Head: Normocephalic and atraumatic.  Right Ear: External ear normal.  Left Ear: External ear normal.  Nose: Nose normal.  Eyes: Right eye exhibits no discharge. Left eye exhibits no discharge.  Neck: Neck supple.  Cardiovascular: Normal rate, regular  rhythm, normal heart sounds and intact distal pulses.   Pulmonary/Chest: Effort normal.  Abdominal: Soft. He exhibits no distension. There is no tenderness.  Musculoskeletal: He exhibits no edema.       Right hip: He exhibits normal range of motion and no tenderness.       Left hip: He exhibits decreased range of motion and tenderness.  Neurological: He is alert. He is disoriented. GCS eye subscore is 4. GCS verbal subscore is 2. GCS motor subscore is 5.  Skin: Skin is warm and dry.  Nursing note and vitals reviewed.   ED Course  Procedures (including critical care time) Labs Review Labs Reviewed  CBC WITH DIFFERENTIAL/PLATELET - Abnormal; Notable for the following:    WBC 12.1 (*)    RBC 3.71 (*)    Hemoglobin 11.5 (*)    HCT 35.9 (*)    Neutrophils Relative % 82 (*)    Neutro Abs 9.8 (*)    Lymphocytes Relative 9 (*)    All other components within normal limits  COMPREHENSIVE METABOLIC PANEL - Abnormal; Notable for the following:    Glucose, Bld 102 (*)    BUN 26 (*)    Calcium 8.5 (*)    Total Protein 6.3 (*)    Albumin 3.2 (*)    ALT <5 (*)    All other components within normal limits  PROTIME-INR  TYPE AND SCREEN  ABO/RH    Imaging Review Dg Hip Unilat With Pelvis 2-3 Views Left  05/11/2015   CLINICAL DATA:  fall on 5/17. Pt was not evaluated at that time, today began to c/o right hip pain  EXAM: LEFT HIP (WITH PELVIS) 2-3 VIEWS  COMPARISON:  None.  FINDINGS: Subcapital left femoral neck fracture, displaced at least 12 mm. Bony pelvis intact. Advanced degenerative changes in the visualized lower lumbar spine.  IMPRESSION: 1. Left subcapital femoral neck fracture.   Electronically Signed   By: Corlis Leak  Hassell M.D.   On: 05/11/2015 20:53     EKG Interpretation None      MDM   Final diagnoses:  Closed left hip fracture, initial encounter    Patient with a left hip fracture, as above. Apparent injury occurred 3 days ago. Discussed with the orthopedic surgeon on call,  Dr. Roda ShuttersXu, who has spoken to wife and will take to OR tomorrow. [facility]. Hospitalist to admit and transfer.    Pricilla LovelessScott Maximillian Habibi, MD 05/11/15 2330

## 2015-05-11 NOTE — Progress Notes (Signed)
Spoke with wife on phone.  She wishes to proceed with surgery tomorrow.  Surgery is planned for tomorrow if patient is clear.  Will have [facility] to [facility].  Mayra ReelN. Michael Khyleigh Furney, MD [facility] [phone] 9:44 PM

## 2015-05-12 ENCOUNTER — Inpatient Hospital Stay (HOSPITAL_COMMUNITY): Payer: Medicare Other

## 2015-05-12 ENCOUNTER — Encounter (HOSPITAL_COMMUNITY)
Admission: EM | Disposition: A | Discharge status: Skilled Nursing Facility | Payer: Self-pay | Source: Home / Self Care | Attending: Internal Medicine

## 2015-05-12 ENCOUNTER — Inpatient Hospital Stay (HOSPITAL_COMMUNITY): Payer: Medicare Other | Admitting: Anesthesiology

## 2015-05-12 ENCOUNTER — Encounter (HOSPITAL_COMMUNITY): Payer: Self-pay | Admitting: Anesthesiology

## 2015-05-12 DIAGNOSIS — S72002A Fracture of unspecified part of neck of left femur, initial encounter for closed fracture: Secondary | ICD-10-CM

## 2015-05-12 DIAGNOSIS — I10 Essential (primary) hypertension: Secondary | ICD-10-CM

## 2015-05-12 DIAGNOSIS — E785 Hyperlipidemia, unspecified: Secondary | ICD-10-CM

## 2015-05-12 DIAGNOSIS — G20 Parkinson's disease: Secondary | ICD-10-CM | POA: Insufficient documentation

## 2015-05-12 DIAGNOSIS — E43 Unspecified severe protein-calorie malnutrition: Secondary | ICD-10-CM | POA: Insufficient documentation

## 2015-05-12 HISTORY — PX: TOTAL HIP ARTHROPLASTY: SHX124

## 2015-05-12 LAB — BASIC METABOLIC PANEL
Anion gap: 10 (ref 5–15)
BUN: 18 mg/dL (ref 6–20)
CALCIUM: 8.4 mg/dL — AB (ref 8.9–10.3)
CO2: 24 mmol/L (ref 22–32)
Chloride: 105 mmol/L (ref 101–111)
Creatinine, Ser: 0.64 mg/dL (ref 0.61–1.24)
GFR calc Af Amer: >60 mL/min (ref >60)
GFR calc non Af Amer: >60 mL/min (ref >60)
GLUCOSE: 89 mg/dL (ref 65–99)
Potassium: 3.9 mmol/L (ref 3.5–5.1)
SODIUM: 139 mmol/L (ref 135–145)

## 2015-05-12 LAB — CBC WITH DIFFERENTIAL/PLATELET
BASOS ABS: 0 10*3/uL (ref 0.0–0.1)
Basophils Relative: 0 % (ref 0–1)
EOS PCT: 1 % (ref 0–5)
Eosinophils Absolute: 0.1 10*3/uL (ref 0.0–0.7)
HCT: 34.9 % — ABNORMAL LOW (ref 39.0–52.0)
Hemoglobin: 11.3 g/dL — ABNORMAL LOW (ref 13.0–17.0)
LYMPHS ABS: 0.9 10*3/uL (ref 0.7–4.0)
LYMPHS PCT: 9 % — AB (ref 12–46)
MCH: 30.8 pg (ref 26.0–34.0)
MCHC: 32.4 g/dL (ref 30.0–36.0)
MCV: 95.1 fL (ref 78.0–100.0)
MONOS PCT: 7 % (ref 3–12)
Monocytes Absolute: 0.7 10*3/uL (ref 0.1–1.0)
Neutro Abs: 8.2 10*3/uL — ABNORMAL HIGH (ref 1.7–7.7)
Neutrophils Relative %: 83 % — ABNORMAL HIGH (ref 43–77)
Platelets: 188 10*3/uL (ref 150–400)
RBC: 3.67 MIL/uL — AB (ref 4.22–5.81)
RDW: 13.4 % (ref 11.5–15.5)
WBC: 9.9 10*3/uL (ref 4.0–10.5)

## 2015-05-12 LAB — SURGICAL PCR SCREEN
MRSA, PCR: NEGATIVE
Staphylococcus aureus: POSITIVE — AB

## 2015-05-12 SURGERY — ARTHROPLASTY, HIP, TOTAL, ANTERIOR APPROACH
Anesthesia: General | Site: Hip | Laterality: Left

## 2015-05-12 MED ORDER — SODIUM CHLORIDE 0.9 % IR SOLN
Status: DC | PRN
  Administered 2015-05-12: 3000 mL

## 2015-05-12 MED ORDER — NEOSTIGMINE METHYLSULFATE 10 MG/10ML IV SOLN
INTRAVENOUS | Status: AC
  Filled 2015-05-12: qty 1

## 2015-05-12 MED ORDER — LIDOCAINE HCL (CARDIAC) 20 MG/ML IV SOLN
INTRAVENOUS | Status: DC | PRN
  Administered 2015-05-12: 60 mg via INTRAVENOUS

## 2015-05-12 MED ORDER — CEFAZOLIN SODIUM-DEXTROSE 2-3 GM-% IV SOLR
INTRAVENOUS | Status: DC | PRN
  Administered 2015-05-12: 2 g via INTRAVENOUS

## 2015-05-12 MED ORDER — MORPHINE SULFATE 2 MG/ML IJ SOLN
0.5000 mg | INTRAMUSCULAR | Status: DC | PRN
Start: 2015-05-12 — End: 2015-05-12

## 2015-05-12 MED ORDER — ONDANSETRON HCL 4 MG/2ML IJ SOLN: 4.0000 mg | Freq: Four times a day (QID) | INTRAMUSCULAR | Status: DC | PRN

## 2015-05-12 MED ORDER — ASPIRIN 325 MG PO TABS
325.0000 mg | ORAL_TABLET | Freq: Two times a day (BID) | ORAL | Status: DC
  Administered 2015-05-12 – 2015-05-15 (×6): 325 mg via ORAL
  Filled 2015-05-12 (×6): qty 1

## 2015-05-12 MED ORDER — GLYCOPYRROLATE 0.2 MG/ML IJ SOLN
INTRAMUSCULAR | Status: AC
  Filled 2015-05-12: qty 2

## 2015-05-12 MED ORDER — ACETAMINOPHEN 325 MG PO TABS
650.0000 mg | ORAL_TABLET | Freq: Four times a day (QID) | ORAL | Status: DC | PRN
  Filled 2015-05-12: qty 2

## 2015-05-12 MED ORDER — PHENOL 1.4 % MT LIQD: 1.0000 | OROMUCOSAL | Status: DC | PRN

## 2015-05-12 MED ORDER — ASPIRIN EC 325 MG PO TBEC: 325.0000 mg | DELAYED_RELEASE_TABLET | Freq: Two times a day (BID) | ORAL | Status: AC

## 2015-05-12 MED ORDER — SODIUM CHLORIDE 0.9 % IV SOLN
INTRAVENOUS | Status: DC
  Administered 2015-05-12 – 2015-05-14 (×2): via INTRAVENOUS

## 2015-05-12 MED ORDER — MIDAZOLAM HCL 2 MG/2ML IJ SOLN
INTRAMUSCULAR | Status: AC
  Filled 2015-05-12: qty 2

## 2015-05-12 MED ORDER — PRO-STAT SUGAR FREE PO LIQD
30.0000 mL | Freq: Two times a day (BID) | ORAL | Status: DC
  Administered 2015-05-12 – 2015-05-15 (×6): 30 mL via ORAL
  Filled 2015-05-12 (×8): qty 30

## 2015-05-12 MED ORDER — METHOCARBAMOL 500 MG PO TABS
500.0000 mg | ORAL_TABLET | Freq: Four times a day (QID) | ORAL | Status: DC | PRN
  Administered 2015-05-13: 500 mg via ORAL
  Filled 2015-05-12 (×2): qty 1

## 2015-05-12 MED ORDER — ALBUMIN HUMAN 5 % IV SOLN
INTRAVENOUS | Status: DC | PRN
  Administered 2015-05-12: 11:00:00 via INTRAVENOUS

## 2015-05-12 MED ORDER — PHENYLEPHRINE 40 MCG/ML (10ML) SYRINGE FOR IV PUSH (FOR BLOOD PRESSURE SUPPORT)
PREFILLED_SYRINGE | INTRAVENOUS | Status: AC
  Filled 2015-05-12: qty 10

## 2015-05-12 MED ORDER — OXYCODONE HCL 5 MG PO TABS: 5.0000 mg | ORAL_TABLET | ORAL | Status: DC | PRN

## 2015-05-12 MED ORDER — ARTIFICIAL TEARS OP OINT
TOPICAL_OINTMENT | OPHTHALMIC | Status: AC
  Filled 2015-05-12: qty 3.5

## 2015-05-12 MED ORDER — ONDANSETRON HCL 4 MG/2ML IJ SOLN
INTRAMUSCULAR | Status: AC
  Filled 2015-05-12: qty 2

## 2015-05-12 MED ORDER — FENTANYL CITRATE (PF) 250 MCG/5ML IJ SOLN
INTRAMUSCULAR | Status: AC
  Filled 2015-05-12: qty 5

## 2015-05-12 MED ORDER — SUCCINYLCHOLINE CHLORIDE 20 MG/ML IJ SOLN
INTRAMUSCULAR | Status: AC
  Filled 2015-05-12: qty 1

## 2015-05-12 MED ORDER — LIDOCAINE HCL (CARDIAC) 20 MG/ML IV SOLN
INTRAVENOUS | Status: AC
  Filled 2015-05-12: qty 5

## 2015-05-12 MED ORDER — PROPOFOL 10 MG/ML IV BOLUS
INTRAVENOUS | Status: AC
  Filled 2015-05-12: qty 20

## 2015-05-12 MED ORDER — METOCLOPRAMIDE HCL 5 MG PO TABS: 5.0000 mg | ORAL_TABLET | Freq: Three times a day (TID) | ORAL | Status: DC | PRN

## 2015-05-12 MED ORDER — NEOSTIGMINE METHYLSULFATE 10 MG/10ML IV SOLN
INTRAVENOUS | Status: DC | PRN
  Administered 2015-05-12: 3 mg via INTRAVENOUS

## 2015-05-12 MED ORDER — FENTANYL CITRATE (PF) 100 MCG/2ML IJ SOLN
25.0000 ug | INTRAMUSCULAR | Status: DC | PRN
  Administered 2015-05-12 (×4): 25 ug via INTRAVENOUS

## 2015-05-12 MED ORDER — PROPOFOL 10 MG/ML IV BOLUS
INTRAVENOUS | Status: DC | PRN
  Administered 2015-05-12: 140 mg via INTRAVENOUS

## 2015-05-12 MED ORDER — METHOCARBAMOL 1000 MG/10ML IJ SOLN
500.0000 mg | Freq: Four times a day (QID) | INTRAVENOUS | Status: DC | PRN
  Filled 2015-05-12: qty 5

## 2015-05-12 MED ORDER — GLYCOPYRROLATE 0.2 MG/ML IJ SOLN
INTRAMUSCULAR | Status: AC
  Filled 2015-05-12: qty 1

## 2015-05-12 MED ORDER — CEFAZOLIN SODIUM-DEXTROSE 2-3 GM-% IV SOLR
2.0000 g | Freq: Four times a day (QID) | INTRAVENOUS | Status: AC
  Administered 2015-05-12 – 2015-05-13 (×3): 2 g via INTRAVENOUS
  Filled 2015-05-12 (×3): qty 50

## 2015-05-12 MED ORDER — GLYCOPYRROLATE 0.2 MG/ML IJ SOLN
INTRAMUSCULAR | Status: DC | PRN
  Administered 2015-05-12: 0.4 mg via INTRAVENOUS

## 2015-05-12 MED ORDER — ALUM & MAG HYDROXIDE-SIMETH 200-200-20 MG/5ML PO SUSP: 30.0000 mL | ORAL | Status: DC | PRN

## 2015-05-12 MED ORDER — ONDANSETRON HCL 4 MG/2ML IJ SOLN
INTRAMUSCULAR | Status: DC | PRN
  Administered 2015-05-12: 4 mg via INTRAVENOUS

## 2015-05-12 MED ORDER — METOCLOPRAMIDE HCL 5 MG/ML IJ SOLN: 5.0000 mg | Freq: Three times a day (TID) | INTRAMUSCULAR | Status: DC | PRN

## 2015-05-12 MED ORDER — ROCURONIUM BROMIDE 100 MG/10ML IV SOLN
INTRAVENOUS | Status: DC | PRN
  Administered 2015-05-12: 35 mg via INTRAVENOUS

## 2015-05-12 MED ORDER — LACTATED RINGERS IV SOLN
INTRAVENOUS | Status: DC
  Administered 2015-05-12: 10:00:00 via INTRAVENOUS

## 2015-05-12 MED ORDER — CEFAZOLIN SODIUM-DEXTROSE 2-3 GM-% IV SOLR
INTRAVENOUS | Status: AC
  Filled 2015-05-12: qty 50

## 2015-05-12 MED ORDER — PHENYLEPHRINE HCL 10 MG/ML IJ SOLN
10.0000 mg | INTRAMUSCULAR | Status: DC | PRN
  Administered 2015-05-12: 100 ug/min via INTRAVENOUS

## 2015-05-12 MED ORDER — 0.9 % SODIUM CHLORIDE (POUR BTL) OPTIME
TOPICAL | Status: DC | PRN
  Administered 2015-05-12: 1000 mL

## 2015-05-12 MED ORDER — ONDANSETRON HCL 4 MG PO TABS: 4.0000 mg | ORAL_TABLET | Freq: Four times a day (QID) | ORAL | Status: DC | PRN

## 2015-05-12 MED ORDER — ACETAMINOPHEN 650 MG RE SUPP: 650.0000 mg | Freq: Four times a day (QID) | RECTAL | Status: DC | PRN

## 2015-05-12 MED ORDER — FENTANYL CITRATE (PF) 100 MCG/2ML IJ SOLN
INTRAMUSCULAR | Status: DC | PRN
  Administered 2015-05-12 (×4): 50 ug via INTRAVENOUS

## 2015-05-12 MED ORDER — HYDROCODONE-ACETAMINOPHEN 5-325 MG PO TABS: 1.0000 | ORAL_TABLET | Freq: Four times a day (QID) | ORAL | Status: DC | PRN

## 2015-05-12 MED ORDER — MENTHOL 3 MG MT LOZG: 1.0000 | LOZENGE | OROMUCOSAL | Status: DC | PRN

## 2015-05-12 MED ORDER — HYDROCODONE-ACETAMINOPHEN 7.5-325 MG PO TABS: 1.0000 | ORAL_TABLET | Freq: Four times a day (QID) | ORAL | Status: DC | PRN

## 2015-05-12 MED ORDER — LACTATED RINGERS IV SOLN
INTRAVENOUS | Status: DC | PRN
  Administered 2015-05-12: 10:00:00 via INTRAVENOUS

## 2015-05-12 MED ORDER — FENTANYL CITRATE (PF) 100 MCG/2ML IJ SOLN
INTRAMUSCULAR | Status: AC
  Filled 2015-05-12: qty 2

## 2015-05-12 MED ORDER — ASPIRIN EC 325 MG PO TBEC
325.0000 mg | DELAYED_RELEASE_TABLET | Freq: Two times a day (BID) | ORAL | Status: DC
  Filled 2015-05-12: qty 1

## 2015-05-12 MED ORDER — ROCURONIUM BROMIDE 50 MG/5ML IV SOLN
INTRAVENOUS | Status: AC
  Filled 2015-05-12: qty 1

## 2015-05-12 MED ORDER — SODIUM CHLORIDE 0.9 % IR SOLN
Status: DC | PRN
  Administered 2015-05-12: 1000 mL

## 2015-05-12 SURGICAL SUPPLY — 48 items
BLADE SAW SGTL 18X1.27X75 (BLADE) ×2 IMPLANT
BLADE SAW SGTL 18X1.27X75MM (BLADE) ×1
BNDG COHESIVE 6X5 TAN STRL LF (GAUZE/BANDAGES/DRESSINGS) ×3 IMPLANT
CAPT HIP HEMI 2 ×3 IMPLANT
CELLS DAT CNTRL 66122 CELL SVR (MISCELLANEOUS) ×1 IMPLANT
COVER SURGICAL LIGHT HANDLE (MISCELLANEOUS) ×3 IMPLANT
DRAPE C-ARM 42X72 X-RAY (DRAPES) ×3 IMPLANT
DRAPE IMP U-DRAPE 54X76 (DRAPES) ×6 IMPLANT
DRAPE STERI IOBAN 125X83 (DRAPES) ×3 IMPLANT
DRAPE U-SHAPE 47X51 STRL (DRAPES) ×6 IMPLANT
DRSG AQUACEL AG ADV 3.5X10 (GAUZE/BANDAGES/DRESSINGS) ×3 IMPLANT
DRSG MEPILEX BORDER 4X8 (GAUZE/BANDAGES/DRESSINGS) IMPLANT
DURAPREP 26ML APPLICATOR (WOUND CARE) ×3 IMPLANT
ELECT BLADE 4.0 EZ CLEAN MEGAD (MISCELLANEOUS) ×3
ELECT REM PT RETURN 9FT ADLT (ELECTROSURGICAL) ×3
ELECTRODE BLDE 4.0 EZ CLN MEGD (MISCELLANEOUS) ×1 IMPLANT
ELECTRODE REM PT RTRN 9FT ADLT (ELECTROSURGICAL) ×1 IMPLANT
FACESHIELD WRAPAROUND (MASK) ×3 IMPLANT
GLOVE NEODERM STRL 7.5 LF PF (GLOVE) ×2 IMPLANT
GLOVE SURG NEODERM 7.5  LF PF (GLOVE) ×4
GLOVE SURG SYN 7.5  E (GLOVE) ×2
GLOVE SURG SYN 7.5 E (GLOVE) ×1 IMPLANT
GOWN SRG XL XLNG 56XLVL 4 (GOWN DISPOSABLE) ×1 IMPLANT
GOWN STRL NON-REIN XL XLG LVL4 (GOWN DISPOSABLE) ×3
GOWN STRL REUS W/ TWL LRG LVL3 (GOWN DISPOSABLE) IMPLANT
GOWN STRL REUS W/TWL LRG LVL3 (GOWN DISPOSABLE)
HANDPIECE INTERPULSE COAX TIP (DISPOSABLE) ×3
KIT BASIN OR (CUSTOM PROCEDURE TRAY) ×3 IMPLANT
MARKER SKIN DUAL TIP RULER LAB (MISCELLANEOUS) ×3 IMPLANT
PACK TOTAL JOINT (CUSTOM PROCEDURE TRAY) ×3 IMPLANT
PACK UNIVERSAL I (CUSTOM PROCEDURE TRAY) ×3 IMPLANT
PADDING CAST COTTON 6X4 STRL (CAST SUPPLIES) IMPLANT
RTRCTR WOUND ALEXIS 18CM MED (MISCELLANEOUS) ×3
SEALER BIPOLAR AQUA 6.0 (INSTRUMENTS) ×3 IMPLANT
SET HNDPC FAN SPRY TIP SCT (DISPOSABLE) ×1 IMPLANT
SOLUTION BETADINE 4OZ (MISCELLANEOUS) ×3 IMPLANT
SUT ETHIBOND 2 V 37 (SUTURE) ×3 IMPLANT
SUT ETHIBOND NAB CT1 #1 30IN (SUTURE) ×6 IMPLANT
SUT ETHILON 3 0 FSL (SUTURE) IMPLANT
SUT VIC AB 0 CT1 27 (SUTURE) ×3
SUT VIC AB 0 CT1 27XBRD ANBCTR (SUTURE) ×1 IMPLANT
SUT VIC AB 1 CT1 27 (SUTURE) ×2
SUT VIC AB 1 CT1 27XBRD ANBCTR (SUTURE) ×1 IMPLANT
SUT VIC AB 2-0 CT1 27 (SUTURE) ×3
SUT VIC AB 2-0 CT1 TAPERPNT 27 (SUTURE) ×1 IMPLANT
SYR 20CC LL (SYRINGE) ×3 IMPLANT
TOWEL OR 17X26 10 PK STRL BLUE (TOWEL DISPOSABLE) ×3 IMPLANT
TRAY FOLEY CATH 16FR SILVER (SET/KITS/TRAYS/PACK) IMPLANT

## 2015-05-12 NOTE — Progress Notes (Signed)
INITIAL NUTRITION ASSESSMENT Pt meets criteria for SEVERE MALNUTRITION in the context of CHRONIC ILLNESS as evidenced by Severe muscle wasting and loss of 18% bw in 6 months . DOCUMENTATION CODES Per approved criteria  -Severe malnutrition in the context of chronic illness -Underweight   INTERVENTION: Magic cup TID with meals, each supplement provides 290 kcal and 9 grams of protein  Once diet advanced, Prostat po BID, each supplement provides 100 Kcal and 15 grams of protein  Recommend Swallow eval. Pt has history of swallowing trouble  Recommend MVI  NUTRITION DIAGNOSIS: Increased protein needs related to the healing of tissue and muscle repletion as evidenced by severe muscle/fat wasting and being POST OP day 1.   Goal: Pt to meet >/= 90% of their estimated nutrition needs   Monitor:  Oral intake, snack/supp preference,   Reason for Assessment: Hip Fracture Protocol  81 y.o. male  Admitting Dx: Closed left hip fracture  ASSESSMENT: 81 y.o. male brought in [id number] his SNF by EMS after x ray at the SNF showed L hip fracture. Fall apparently was 4 days ago on 5/17. Now in OR. Pt also has parkinsons, HTN, HLD  Spoke with the wife. She is also concerned about his nutritional status. Starting in November, She states that she took care of the patient fat home and made sure he ate really well.  Then he moved to an assisted living facility. Per the facility, pt ate well, though the wife questions this.  In April,  [facility] From there he went to a SNF until this admission when he came in for surgery. Wife states she would bring pt starbucks shakes and high protein beverages, but he still has been losing weight.   Per wife, Pt has dementia and Wife said she has to cue him to eat.   Of note, wife mentioned that [facility] he had swallowing difficulties and was on thickened liquids. She says he hasnt had swallowing issues since then.   He has troubles with  constipation. She thinks this may be related to the Parkinsons. We talked about the importance of the pt staying hydrated  She asked that I write in pt's dc orders to give pt protein supp/high protein snack in between meals and double protein at meals.  She states his height is 5'8.   Height: Ht Readings from Last 1 Encounters:  04/07/15 5\' 10"  (1.778 m)  5/21: Wife states 5\' 8"  (172.72 cm)  Weight: Wt Readings from Last 1 Encounters:  04/11/15 107 lb 5.8 oz (48.7 kg)    Ideal Body Weight: 166 lbs  % Ideal Body Weight: 64%  Wt Readings from Last 10 Encounters:  04/11/15 107 lb 5.8 oz (48.7 kg)  09/29/14 131 lb (59.421 kg)  08/09/14 131 lb (59.421 kg)  08/29/13 131 lb (59.421 kg)  03/23/13 135 lb (61.236 kg)  11/15/12 138 lb (62.596 kg)  09/30/11 137 lb (62.143 kg)  04/01/11 137 lb (62.143 kg)  09/12/10 135 lb (61.236 kg)  03/19/10 137 lb (62.143 kg)  Pt has lost 24 lbs in 6 months-signigicant  Usual Body Weight: 135  % Usual Body Weight: 79%  BMI: 16.3 (calculated w/ height from family member)  Estimated Nutritional Needs: Kcal: 1600-1700 kcals (33-35 kcal/kg Protein: 73-88 g/pro (1.5-1.8 g/kg) Fluid: 1.6-1.7 liters  Skin: Skin tear, surgical wounds, pale, pink areas to all bony prominences, abrasions  Diet Order: Diet NPO time specified  EDUCATION NEEDS: -No education needs  identified at this time  Intake/Output Summary (Last 24 hours) at 05/12/15 1030 Last data filed at 05/12/15 1610  Gross per 24 hour  Intake      0 ml  Output      0 ml  Net      0 ml    Last BM: Unknown  Labs:   Recent Labs Lab 05/11/15 2046  NA 140  K 3.7  CL 105  CO2 26  BUN 26*  CREATININE 0.83  CALCIUM 8.5*  GLUCOSE 102*    CBG (last 3)  No results for input(s): GLUCAP in the last 72 hours.  Scheduled Meds: . [MAR Hold] aspirin EC  325 mg Oral Daily  . [MAR Hold] atorvastatin  20 mg Oral q1800  . [MAR Hold] carbidopa-levodopa  1 tablet Oral 4 times per day  .  [MAR Hold] rivastigmine  3 mg Oral BID    Continuous Infusions: . lactated ringers 10 mL/hr at 05/12/15 9604    Past Medical History  Diagnosis Date  . CAD (coronary artery disease)     [place], stent to native Rt wth DES 2004 hx cardiogenic shock, IABP, myoview 2013 negative ischemia  . Myocardial infarction 2004  . Osteopenia   . Parkinson disease   . Hyperlipidemia   . Hypertension   . 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 Hypotension 02/2013    lisinopril stopped. + falls  . Dementia     Past Surgical History  Procedure Laterality Date  . Coronary artery bypass graft  1996    X 4  . Total knee arthroplasty      bil  . Carotid stent      x3  . Cardiac catheterization  06/20/03    occl VG-RCA, occl. VG-LCX, patent VG-DIAG, distal LIMA MOD DISEASE  EF 45%  . Coronary angioplasty with stent placement  07/31/03    DES TO prox, mid and distal RCA    Christophe Louis RD, LDN Nutrition Pager: [phone] 05/12/2015 10:30 AM

## 2015-05-12 NOTE — Anesthesia Postprocedure Evaluation (Signed)
  Anesthesia Post-op Note  Patient: Keith Francis  Procedure(s) Performed: Procedure(s): LEFT HIP HEMI- ARTHROPLASTY DIRECT ANTERIOR APPROACH (Left)  Patient Location: PACU  Anesthesia Type:General  Level of Consciousness: awake  Airway and Oxygen Therapy: Patient Spontanous Breathing  Post-op Pain: mild  Post-op Assessment: Post-op Vital signs reviewed  Post-op Vital Signs: Reviewed  Last Vitals:  Filed Vitals:   05/12/15 1245  BP: 162/89  Pulse: 92  Temp:   Resp: 10    Complications: No apparent anesthesia complications

## 2015-05-12 NOTE — Progress Notes (Signed)
[facility] PROGRESS NOTE  Jeremyah Jelley ZOX:[id number] DOB: 20-Oct-1933 DOA: 05/11/2015 PCP: Rogelia Boga, MD  Assessment/Plan: #1 left femoral neck fracture Secondary to mechanical fall. Patient status post prosthetic replacement of femoral neck fracture 05/12/2015 per Dr.Xu. PT/OT. Per orthopedics.  #2 Parkinson's disease Continue Sinemet and rivastigmine  #3 hypertension Stable. Follow.  #4 hyperlipidemia Continue statin.  #5 prophylaxis DVT prophylaxis per otho  Code Status: Full Family Communication: No family at bedside. Patient sleeping postoperatively. Disposition Plan: SNF when medically stable.   Consultants:  Orthopedics: Dr.Xu 05/12/2015  Procedures: Prosthetic replacement for femoral neck fracture X-ray of the pelvis 05/12/2015  Antibiotics:  None  HPI/Subjective: Patient sleeping. Postoperative.  Objective: Filed Vitals:   05/12/15 1401  BP: 113/71  Pulse: 92  Temp: 99.4 F (37.4 C)  Resp: 16    Intake/Output Summary (Last 24 hours) at 05/12/15 1724 Last data filed at 05/12/15 1211  Gross per 24 hour  Intake   1150 ml  Output    200 ml  Net    950 ml   There were no vitals filed for this visit.  Exam:   General:  Sleeping. Postop.  Cardiovascular: Regular rate and rhythm  Respiratory: Clear to auscultation bilaterally anterior lung fields  Abdomen: Soft, nontender, nondistended, positive bowel sounds.  Musculoskeletal: No clubbing cyanosis or edema.  Data Reviewed: Basic Metabolic Panel:  Recent Labs Lab 05/11/15 2046 05/12/15 0900  NA 140 139  K 3.7 3.9  CL 105 105  CO2 26 24  GLUCOSE 102* 89  BUN 26* 18  CREATININE 0.83 0.64  CALCIUM 8.5* 8.4*   Liver Function Tests:  Recent Labs Lab 05/11/15 2046  AST 19  ALT <5*  ALKPHOS 54  BILITOT 0.5  PROT 6.3*  ALBUMIN 3.2*   No results for input(s): LIPASE, AMYLASE in the last 168 hours. No results for input(s): AMMONIA in the last 168  hours. CBC:  Recent Labs Lab 05/11/15 2046 05/12/15 0900  WBC 12.1* 9.9  NEUTROABS 9.8* 8.2*  HGB 11.5* 11.3*  HCT 35.9* 34.9*  MCV 96.8 95.1  PLT 172 188   Cardiac Enzymes: No results for input(s): CKTOTAL, CKMB, CKMBINDEX, TROPONINI in the last 168 hours. BNP (last 3 results) No results for input(s): BNP in the last 8760 hours.  ProBNP (last 3 results) No results for input(s): PROBNP in the last 8760 hours.  CBG: No results for input(s): GLUCAP in the last 168 hours.  Recent Results (from the past 240 hour(s))  Surgical pcr screen     Status: Abnormal   Collection Time: 05/12/15  4:19 AM  Result Value Ref Range Status   MRSA, PCR NEGATIVE NEGATIVE Final   Staphylococcus aureus POSITIVE (A) NEGATIVE Final    Comment:        The Xpert SA Assay (FDA approved for NASAL specimens in patients over 14 years of age), is one component of a comprehensive surveillance program.  Test performance has been validated by [facility] for patients greater than or equal to 29 year old. It is not intended to diagnose infection nor to guide or monitor treatment.      Studies: Dg Hip Operative Unilat With Pelvis Left  05/12/2015   CLINICAL DATA:  Closed left hip fracture.  EXAM: OPERATIVE LEFT HIP (WITH PELVIS IF PERFORMED) 4 VIEWS  TECHNIQUE: Fluoroscopic spot image(s) were submitted for interpretation post-operatively.  FLUOROSCOPY TIME:  Radiation Exposure Index (as provided by the fluoroscopic device): Not applicable  If the device does not provide the  exposure index:  Fluoroscopy Time:  0 minutes 19 seconds  Number of Acquired Images:  4  COMPARISON:  None.  FINDINGS: The two initial images demonstrate a nondisplaced left femoral neck fracture. Two subsequent images demonstrate a left hip prosthesis in satisfactory position and alignment. No fracture or dislocation seen on those images.  IMPRESSION: Left hip prosthesis in satisfactory position and alignment.   Electronically Signed    By: Beckie SaltsSteven  Reid M.D.   On: 05/12/2015 13:00   Dg Hip Unilat With Pelvis 2-3 Views Left  05/11/2015   CLINICAL DATA:  fall on 5/17. Pt was not evaluated at that time, today began to c/o right hip pain  EXAM: LEFT HIP (WITH PELVIS) 2-3 VIEWS  COMPARISON:  None.  FINDINGS: Subcapital left femoral neck fracture, displaced at least 12 mm. Bony pelvis intact. Advanced degenerative changes in the visualized lower lumbar spine.  IMPRESSION: 1. Left subcapital femoral neck fracture.   Electronically Signed   By: Corlis Leak  Hassell M.D.   On: 05/11/2015 20:53    Scheduled Meds: . aspirin EC  325 mg Oral BID  . atorvastatin  20 mg Oral q1800  . carbidopa-levodopa  1 tablet Oral 4 times per day  . ceFAZolin      .  ceFAZolin (ANCEF) IV  2 g Intravenous Q6H  . feeding supplement (PRO-STAT SUGAR FREE 64)  30 mL Oral BID  . fentaNYL      . rivastigmine  3 mg Oral BID   Continuous Infusions: . sodium chloride    . lactated ringers 10 mL/hr at 05/12/15 [id number]    Principal Problem:   Closed left hip fracture Active Problems:   PARKINSON'S DISEASE   Essential hypertension   Protein-calorie malnutrition, severe    Time spent: 35 minutes    THOMPSON,DANIEL M.D. [facility] Pager [phone]. If 7PM-7AM, please contact night-coverage at [email or web address], password [email or web address] 05/12/2015, 5:24 PM  LOS: 1 day

## 2015-05-12 NOTE — H&P (Signed)
H&P update  The surgical history has been reviewed and remains accurate without interval change.  The patient was re-examined and patient's physiologic condition has not changed significantly in the last 30 days. The condition still exists that makes this procedure necessary. The treatment plan remains the same, without new options for care.  No new pharmacological allergies or types of therapy has been initiated that would change the plan or the appropriateness of the plan.  The patient and/or family understand the potential benefits and risks.  Mayra ReelN. Michael Xu, MD 05/12/2015 9:37 AM

## 2015-05-12 NOTE — Transfer of Care (Signed)
Immediate Anesthesia Transfer of Care Note  Patient: Keith MouldsRobert Francis  Procedure(s) Performed: Procedure(s): LEFT HIP HEMI- ARTHROPLASTY DIRECT ANTERIOR APPROACH (Left)  Patient Location: PACU  Anesthesia Type:General  Level of Consciousness: awake, confused and responds to stimulation  Airway & Oxygen Therapy: Patient Spontanous Breathing and Patient connected to nasal cannula oxygen  Post-op Assessment: Report given to RN, Post -op Vital signs reviewed and stable and Patient moving all extremities  Post vital signs: Reviewed and stable  Last Vitals:  Filed Vitals:   05/12/15 0919  BP: 119/73  Pulse: 85  Temp: 36.9 C  Resp: 16    Complications: No apparent anesthesia complications

## 2015-05-12 NOTE — Op Note (Signed)
LEFT HIP HEMI- ARTHROPLASTY DIRECT ANTERIOR APPROACH  Procedure Note Keith MouldsRobert Anchondo   [id number]  Pre-op Diagnosis: left hip fracture     Post-op Diagnosis: same   Operative Procedures  1. Prosthetic replacement for femoral neck fracture. CPT [phone]  Personnel  Surgeon(s): Tarry KosNaiping M Kadance Mccuistion, MD   Anesthesia: general  Prosthesis: Depuy Femur: Corail KA 11 Head: 51 mm size: +8.5 Bearing Type: Bipolar  Date of Service: 05/11/2015 - 05/12/2015  Hip Hemiarthroplasty (Anterior Approach) Op Note:  After informed consent was obtained and the operative extremity marked in the holding area, the patient was brought back to the operating room and placed supine on the HANA table. Next, the operative extremity was prepped and draped in normal sterile fashion. Surgical timeout occurred verifying patient identification, surgical site, surgical procedure and administration of antibiotics.  A modified anterior Smith-Peterson approach to the hip was performed, using the interval between tensor fascia lata and sartorius.  Dissection was carried bluntly down onto the anterior hip capsule. The lateral femoral circumflex vessels were identified and coagulated. A capsulotomy was performed and the capsular flaps tagged for later repair.  Fluoroscopy was utilized to prepare for the femoral neck cut. The neck osteotomy was performed. The femoral head was removed and found a 51 mm head was the appropriate fit.    We then turned our attention to the femur.  After placing the femoral hook, the leg was taken to externally rotated, extended and adducted position taking care to perform soft tissue releases to allow for adequate mobilization of the femur. Soft tissue was cleared from the shoulder of the greater trochanter and the hook elevator used to improve exposure of the proximal femur. Sequential broaching performed up to a size 11. Trial neck and head were placed. The leg was brought back up to neutral and the construct  reduced. The position and sizing of components, offset and leg lengths were checked using fluoroscopy. Stability of the construct was checked in extension and external rotation without any subluxation or impingement of prosthesis. We dislocated the prosthesis, dropped the leg back into position, removed trial components, and irrigated copiously. The final stem and head was then placed, the leg brought back up, the system reduced and fluoroscopy used to verify positioning.  We irrigated, obtained hemostasis and closed the capsule using #1 ethibond suture.  The fascia was closed with #1 vicryl plus, the deep fat layer was closed with 0 vicryl, the subcutaneous layers closed with 2.0 Vicryl Plus and the skin closed with staples . A sterile dressing was applied. The patient was awakened in the operating room and taken to recovery in stable condition. All sponge, needle, and instrument counts were correct at the end of the case.   Position: supine  Complications: none.  Time Out: performed   Drains/Packing: none  Estimated blood loss: 200 cc  Returned to Recovery Room: in good condition.   Antibiotics: yes   Mechanical VTE (DVT) Prophylaxis: sequential compression devices, TED thigh-high  Chemical VTE (DVT) Prophylaxis: aspirin Fluid Replacement  Crystalloid: see anesthesia record  Specimens Removed: 1 to pathology   Sponge and Instrument Count Correct? yes   PACU: portable radiograph - low AP   Admission: inpatient status, start PT & OT POD#1  Plan/RTC: Return in 2 weeks for staple removal. Return in 6 weeks to see MD.  Weight Bearing/Load Lower Extremity: full  Hip precautions: none Suture Removal: 10-14 days  Betadine to incision twice daily once dressing is removed on POD#7  N.  Glee Arvin, MD [facility] [phone] 2:25 PM     Implant Name Type Inv. Item Serial No. Manufacturer Lot No. LRB No. Used  STEM CORAIL KA11 - [id number] Stem STEM CORAIL KA11  DEPUY [id number]  Left 1  BIPOLAR DEPUY - [id number] Hips BIPOLAR DEPUY  DEPUY [id number] Left 1  HIP BALL ARTICU DEPUY - [id number] Hips HIP BALL ARTICU DEPUY   DEPUY [id number] Left 1

## 2015-05-12 NOTE — Discharge Instructions (Signed)
1. Change dressings as needed 2. May shower but keep incisions covered and dry 3. Take aspirin to prevent blood clots 4. Take stool softeners as needed 5. Take pain meds as needed 6. Please makes sure pt has 2x protein at meals  7. High Protein snacks/Supplements between meals

## 2015-05-12 NOTE — Anesthesia Procedure Notes (Signed)
Procedure Name: Intubation Date/Time: 05/12/2015 10:23 AM Performed by: Ferol LuzMCMILLEN, Daelin Haste L Pre-anesthesia Checklist: Patient identified, Emergency Drugs available, Suction available, Patient being monitored and Timeout performed Patient Re-evaluated:Patient Re-evaluated prior to inductionOxygen Delivery Method: Circle system utilized Preoxygenation: Pre-oxygenation with 100% oxygen Intubation Type: IV induction and Cricoid Pressure applied Ventilation: Mask ventilation without difficulty Laryngoscope Size: Mac and 3 Grade View: Grade II Tube type: Oral Tube size: 7.5 mm Number of attempts: 1 Airway Equipment and Method: Stylet Placement Confirmation: ETT inserted through vocal cords under direct vision,  positive ETCO2 and breath sounds checked- equal and bilateral Secured at: 22 cm Tube secured with: Tape Dental Injury: Teeth and Oropharynx as per pre-operative assessment

## 2015-05-12 NOTE — Anesthesia Preprocedure Evaluation (Addendum)
Anesthesia Evaluation  Patient identified by MRN, date of birth, ID band Patient awake    Reviewed: Allergy & Precautions, NPO status , Patient's Chart, lab work & pertinent test results  Airway Mallampati: II  TM Distance: >3 FB Neck ROM: Full    Dental   Pulmonary  breath sounds clear to auscultation        Cardiovascular hypertension, + CAD and + Past MI Rhythm:Regular Rate:Normal     Neuro/Psych    GI/Hepatic negative GI ROS,   Endo/Other  negative endocrine ROS  Renal/GU negative Renal ROS     Musculoskeletal   Abdominal   Peds  Hematology   Anesthesia Other Findings   Reproductive/Obstetrics                            Anesthesia Physical Anesthesia Plan  ASA: III  Anesthesia Plan: General   Post-op Pain Management:    Induction: Intravenous  Airway Management Planned: Oral ETT  Additional Equipment:   Intra-op Plan:   Post-operative Plan: Extubation in OR  Informed Consent: I have reviewed the patients History and Physical, chart, labs and discussed the procedure including the risks, benefits and alternatives for the proposed anesthesia with the patient or authorized representative who has indicated his/her understanding and acceptance.   Dental advisory given  Plan Discussed with: CRNA, Anesthesiologist and Surgeon  Anesthesia Plan Comments:         Anesthesia Quick Evaluation

## 2015-05-12 NOTE — Consult Note (Signed)
ORTHOPAEDIC CONSULTATION  REQUESTING PHYSICIAN: Rodolph Bonganiel Thompson V, MD  Chief Complaint: Left hip fx  HPI: Keith Francis is a 81 y.o. male who complains of left hip pain s/p fall.  Patient has parkinson's dementia at baseline and is DNR.  C/o severe pain in left hip with any movement or weight bearing.  Ortho consulted for left hip fx.  Past Medical History  Diagnosis Date  . CAD (coronary artery disease)     [place], stent to native Rt wth DES 2004 hx cardiogenic shock, IABP, myoview 2013 negative ischemia  . Myocardial infarction 2004  . Osteopenia   . Parkinson disease   . Hyperlipidemia   . Hypertension   . 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 Hypotension 02/2013    lisinopril stopped. + falls  . Dementia    Past Surgical History  Procedure Laterality Date  . Coronary artery bypass graft  1996    X 4  . Total knee arthroplasty      bil  . Carotid stent      x3  . Cardiac catheterization  06/20/03    occl VG-RCA, occl. VG-LCX, patent VG-DIAG, distal LIMA MOD DISEASE  EF 45%  . Coronary angioplasty with stent placement  07/31/03    DES TO prox, mid and distal RCA   History   Social History  . Marital Status: Married    Spouse Name: N/A  . Number of Children: N/A  . Years of Education: N/A   Social History Main Topics  . Smoking status: Never Smoker   . Smokeless tobacco: Never Used  . Alcohol Use: Yes  . Drug Use: No  . Sexual Activity: No   Other Topics Concern  . None   Social History Narrative   Family History  Problem Relation Age of Onset  . Heart disease Mother   . Hypertension Mother   . Arthritis Mother   . Heart disease Father   . Kidney disease Father     renal failure  . Heart disease Sister   . Lung disease Brother    No Known Allergies Prior to Admission medications   Medication Sig Start Date End Date Taking? Authorizing Provider  acetaminophen (TYLENOL) 325 MG tablet Take 2 tablets (650 mg total) by mouth  every 12 (twelve) hours as needed. Patient taking differently: Take 650 mg by mouth every 12 (twelve) hours as needed for mild pain (pain).  12/04/14  Yes Doe-Hyun Sherran Needs Yoo, DO  Alum & Mag Hydroxide-Simeth (MAGIC MOUTHWASH) SOLN Take 5 mLs by mouth 3 (three) times daily.   Yes Historical Provider, MD  Amino Acids-Protein Hydrolys (FEEDING SUPPLEMENT, PRO-STAT SUGAR FREE 64,) LIQD Take 30 mLs by mouth 3 (three) times daily with meals.   Yes Historical Provider, MD  Ascorbic Acid (VITAMIN C) 500 MG tablet Take 500 mg by mouth daily.     Yes Historical Provider, MD  aspirin 81 MG chewable tablet Chew 81 mg by mouth daily.   Yes Historical Provider, MD  atorvastatin (LIPITOR) 20 MG tablet Take 20 mg by mouth daily at 6 PM.  04/05/15  Yes Historical Provider, MD  carbidopa-levodopa (SINEMET CR) 50-200 MG per tablet Take 1 tablet by mouth 4 (four) times daily. Take 1 Tablet at 8 am, Take 1 Tablet at 12 pm , Take 1 Tablet at 4 pm, Take 1 Tablet at 8 pm.   Yes Historical Provider, MD  rivastigmine (EXELON) 3 MG capsule Take 3 mg by  mouth 2 (two) times daily.   Yes Historical Provider, MD   Dg Hip Unilat With Pelvis 2-3 Views Left  05/11/2015   CLINICAL DATA:  fall on 5/17. Pt was not evaluated at that time, today began to c/o right hip pain  EXAM: LEFT HIP (WITH PELVIS) 2-3 VIEWS  COMPARISON:  None.  FINDINGS: Subcapital left femoral neck fracture, displaced at least 12 mm. Bony pelvis intact. Advanced degenerative changes in the visualized lower lumbar spine.  IMPRESSION: 1. Left subcapital femoral neck fracture.   Electronically Signed   By: Corlis Leak M.D.   On: 05/11/2015 20:53    Positive ROS: All other systems have been reviewed and were otherwise negative with the exception of those mentioned in the HPI and as above.  Physical Exam: General: NAD Cardiovascular: No pedal edema Respiratory: No cyanosis, no use of accessory musculature GI: No organomegaly, abdomen is soft and non-tender Skin: No lesions  in the area of chief complaint Neurologic: Unable to test 2/2 dementia Psychiatric: Patient has dementia Lymphatic: No axillary or cervical lymphadenopathy  MUSCULOSKELETAL:  - pain with movement of LLE - skin intact - NVI distally - foot wwp  Assessment: Left femoral neck fx  Plan: - discussed with wife about surgical vs. nonsurgical options - surgery would give patient best chance for future ambulation and pain relief - she understands surgical risks and wishes to proceed - consent signed - plan for direct anterior hemiarthroplasty  Thank you for the consult and the opportunity to see Keith Francis. Glee Arvin, MD [facility] [phone] 9:37 AM

## 2015-05-12 NOTE — Progress Notes (Signed)
[facility] given to wife nancy

## 2015-05-13 LAB — BASIC METABOLIC PANEL
Anion gap: 9 (ref 5–15)
BUN: 14 mg/dL (ref 6–20)
CALCIUM: 8 mg/dL — AB (ref 8.9–10.3)
CO2: 23 mmol/L (ref 22–32)
CREATININE: 0.68 mg/dL (ref 0.61–1.24)
Chloride: 105 mmol/L (ref 101–111)
GFR calc Af Amer: >60 mL/min (ref >60)
GLUCOSE: 105 mg/dL — AB (ref 65–99)
POTASSIUM: 3.9 mmol/L (ref 3.5–5.1)
Sodium: 137 mmol/L (ref 135–145)

## 2015-05-13 LAB — CBC
HCT: 33.8 % — ABNORMAL LOW (ref 39.0–52.0)
Hemoglobin: 11 g/dL — ABNORMAL LOW (ref 13.0–17.0)
MCH: 31.3 pg (ref 26.0–34.0)
MCHC: 32.5 g/dL (ref 30.0–36.0)
MCV: 96.3 fL (ref 78.0–100.0)
Platelets: 144 10*3/uL — ABNORMAL LOW (ref 150–400)
RBC: 3.51 MIL/uL — ABNORMAL LOW (ref 4.22–5.81)
RDW: 13.6 % (ref 11.5–15.5)
WBC: 9.9 10*3/uL (ref 4.0–10.5)

## 2015-05-13 NOTE — Clinical Social Work Note (Signed)
Clinical Social Work Assessment  Patient Details  Name: Keith Francis MRN: [id number] Date of Birth: 11/08/1933  Date of referral:  05/13/15               Reason for consult:  Facility Placement, Discharge Planning                Permission sought to share information with:  Case Manager, Facility Sport and exercise psychologist, PCP, Family Supports Permission granted to share information::  Yes, Verbal Permission Granted  Name::      Maxine Huynh)  Agency::   (Yes, [employer] [employer] )  Relationship::   (Spouse)  Contact Information:   [phone])  Housing/Transportation Living arrangements for the past 2 months:  [place] of Information:  Spouse Patient Interpreter Needed:  None Criminal Activity/Legal Involvement Pertinent to Current Situation/Hospitalization:  No - Comment as needed Significant Relationships:  Adult Children, Spouse Lives with:  Facility Resident Do you feel safe going back to the place where you live?  Yes Need for family participation in patient care:  No (Coment)  Care giving concerns:  No care giving concerns reported at this time.    Social Worker assessment / plan:  Holiday representative met with patient and pt's wife, Izora Gala present at bedside in reference to pt's return to SNF. CSW introduced CSW role and SNF process. Pt's wife reported pt is a long-term resident of SNF, [facility] and she plans for him to return once medically stable. Pt's wife further shared the positive experience of [facility] and reported pt became a resident over a month ago. No further concerns reported at this time. FL-2 completed and faxed to [facility]. CSW will continue to follow pt and pt's family for continued support and to facilitate pt's discharge once medically stable.  Employment status:  Retired Forensic scientist:  Medicare PT Recommendations:  [place] / Referral to community resources:  [place]  Patient/Family's Response to care:  Pt sitting at bedside alert and disoriented. Pt's wife sitting at bedside supportive and very involved in pt's care. Pt's wife shared positive experience at [facility] and further stated she is pleased with the care. Pt's wife pleasant and appreciated social work intervention.   Patient/Family's Understanding of and Emotional Response to Diagnosis, Current Treatment, and Prognosis: Pt's wife expressed understanding of surgical procedure and current treatment however asked several questions in regards to medical equipment. CSW contacted pt's nurse to further assist pt's wife with medical questions. Pt's wife agreeable to pt returning to [facility].   Emotional Assessment Appearance:  Appears older than stated age Attitude/Demeanor/Rapport:  Unable to Assess Affect (typically observed):  Unable to Assess Orientation:  Oriented to Self Alcohol / Substance use:  Never Used Psych involvement (Current and /or in the community):  No (Comment)  Discharge Needs  Concerns to be addressed:  No discharge needs identified Readmission within the last 30 days:  No Current discharge risk:  None Barriers to Discharge:  No Barriers Identified   Rozell Searing, LCSW 05/13/2015, 3:34 PM

## 2015-05-13 NOTE — Progress Notes (Signed)
[facility] PROGRESS NOTE  Keith Francis ZOX:[id number] DOB: [birth year] DOA: 05/11/2015 PCP: Rogelia Boga, MD  Assessment/Plan: #1 left femoral neck fracture Secondary to mechanical fall. Patient status post prosthetic replacement of femoral neck fracture 05/12/2015 per Dr.Xu. PT/OT. Per orthopedics. Need skilled nursing facility.  #2 Parkinson's disease Continue Sinemet and rivastigmine  #3 hypertension Stable. Follow.  #4 hyperlipidemia Continue statin.  #5 prophylaxis DVT prophylaxis per otho  Code Status: Full Family Communication: No family at bedside.  Disposition Plan: SNF when medically stable.   Consultants:  Orthopedics: Dr.Xu 05/12/2015  Procedures: Prosthetic replacement for femoral neck fracture X-ray of the pelvis 05/12/2015  Antibiotics:  None  HPI/Subjective: Patient with a blank stare. Patient barely answering questions. Patient states thank you at the end of the interview.  Objective: Filed Vitals:   05/13/15 1300  BP: 122/57  Pulse: 96  Temp: 99.2 F (37.3 C)  Resp: 18    Intake/Output Summary (Last 24 hours) at 05/13/15 1833 Last data filed at 05/13/15 1700  Gross per 24 hour  Intake   1335 ml  Output     50 ml  Net   1285 ml   There were no vitals filed for this visit.  Exam:   General:  NAD  Cardiovascular: Regular rate and rhythm  Respiratory: Clear to auscultation bilaterally anterior lung fields  Abdomen: Soft, nontender, nondistended, positive bowel sounds.  Musculoskeletal: No clubbing cyanosis or edema.  Data Reviewed: Basic Metabolic Panel:  Recent Labs Lab 05/11/15 2046 05/12/15 0900 05/13/15 0430  NA 140 139 137  K 3.7 3.9 3.9  CL 105 105 105  CO2 GLUCOSE 102* 89 105*  BUN 26* 18 14  CREATININE 0.83 0.64 0.68  CALCIUM 8.5* 8.4* 8.0*   Liver Function Tests:  Recent Labs Lab 05/11/15 2046  AST 19  ALT <5*  ALKPHOS 54  BILITOT 0.5  PROT 6.3*  ALBUMIN 3.2*   No  results for input(s): LIPASE, AMYLASE in the last 168 hours. No results for input(s): AMMONIA in the last 168 hours. CBC:  Recent Labs Lab 05/11/15 2046 05/12/15 0900 05/13/15 0430  WBC 12.1* 9.9 9.9  NEUTROABS 9.8* 8.2*  --   HGB 11.5* 11.3* 11.0*  HCT 35.9* 34.9* 33.8*  MCV 96.8 95.1 96.3  PLT 172 188 144*   Cardiac Enzymes: No results for input(s): CKTOTAL, CKMB, CKMBINDEX, TROPONINI in the last 168 hours. BNP (last 3 results) No results for input(s): BNP in the last 8760 hours.  ProBNP (last 3 results) No results for input(s): PROBNP in the last 8760 hours.  CBG: No results for input(s): GLUCAP in the last 168 hours.  Recent Results (from the past 240 hour(s))  Surgical pcr screen     Status: Abnormal   Collection Time: 05/12/15  4:19 AM  Result Value Ref Range Status   MRSA, PCR NEGATIVE NEGATIVE Final   Staphylococcus aureus POSITIVE (A) NEGATIVE Final    Comment:        The Xpert SA Assay (FDA approved for NASAL specimens in patients over 70 years of age), is one component of a comprehensive surveillance program.  Test performance has been validated by [facility] for patients greater than or equal to 93 year old. It is not intended to diagnose infection nor to guide or monitor treatment.      Studies: Pelvis Portable  05/12/2015   CLINICAL DATA:  Postoperative evaluation status post left hip replacement.  EXAM: PORTABLE PELVIS 1-2 VIEWS  COMPARISON:  Hip radiograph 05/11/2015  FINDINGS: Patient status post left hip hemiarthroplasty. Hardware appears in appropriate position. Gas within the overlying soft tissues compatible postoperative state. Overlying surgical staple line.  IMPRESSION: Satisfactory appearance left hip hardware.   Electronically Signed   By: Annia Beltrew  Davis M.D.   On: 05/12/2015 18:49   Dg Hip Operative Unilat With Pelvis Left  05/12/2015   CLINICAL DATA:  Closed left hip fracture.  EXAM: OPERATIVE LEFT HIP (WITH PELVIS IF PERFORMED) 4 VIEWS   TECHNIQUE: Fluoroscopic spot image(s) were submitted for interpretation post-operatively.  FLUOROSCOPY TIME:  Radiation Exposure Index (as provided by the fluoroscopic device): Not applicable  If the device does not provide the exposure index:  Fluoroscopy Time:  0 minutes 19 seconds  Number of Acquired Images:  4  COMPARISON:  None.  FINDINGS: The two initial images demonstrate a nondisplaced left femoral neck fracture. Two subsequent images demonstrate a left hip prosthesis in satisfactory position and alignment. No fracture or dislocation seen on those images.  IMPRESSION: Left hip prosthesis in satisfactory position and alignment.   Electronically Signed   By: Beckie SaltsSteven  Reid M.D.   On: 05/12/2015 13:00   Dg Hip Unilat With Pelvis 2-3 Views Left  05/11/2015   CLINICAL DATA:  fall on 5/17. Pt was not evaluated at that time, today began to c/o right hip pain  EXAM: LEFT HIP (WITH PELVIS) 2-3 VIEWS  COMPARISON:  None.  FINDINGS: Subcapital left femoral neck fracture, displaced at least 12 mm. Bony pelvis intact. Advanced degenerative changes in the visualized lower lumbar spine.  IMPRESSION: 1. Left subcapital femoral neck fracture.   Electronically Signed   By: Corlis Leak  Hassell M.D.   On: 05/11/2015 20:53    Scheduled Meds: . aspirin  325 mg Oral BID  . atorvastatin  20 mg Oral q1800  . carbidopa-levodopa  1 tablet Oral 4 times per day  . feeding supplement (PRO-STAT SUGAR FREE 64)  30 mL Oral BID  . rivastigmine  3 mg Oral BID   Continuous Infusions: . sodium chloride 20 mL/hr at 05/13/15 0600  . lactated ringers 10 mL/hr at 05/12/15 [id number]    Principal Problem:   Closed left hip fracture Active Problems:   PARKINSON'S DISEASE   Essential hypertension   Protein-calorie malnutrition, severe   Parkinson disease    Time spent: 35 minutes    THOMPSON,DANIEL M.D. [facility] Pager [phone]. If 7PM-7AM, please contact night-coverage at [email or web address], password [email or web address] 05/13/2015, 6:33 PM  LOS:  2 days

## 2015-05-13 NOTE — Evaluation (Signed)
Physical Therapy Evaluation Patient Details Name: Keith Francis MRN: [id number] DOB: [birth year] Today's Date: 05/13/2015   History of Present Illness  Patient admitted s/p fall with resulting hip fx.  Underwent direct anterior hemiarthroplasty 05/12/15.  Patient with history of parkinsons, dementia.  Clinical Impression  Patient with limited participation during therapy due to Parkinson's and dementia.  Patient presents with dependencies in mobility and balance and will benefit from skilled PT to increase mobility and decrease caregiver burden during  Mobility.      Follow Up Recommendations SNF    Equipment Recommendations  None recommended by PT    Recommendations for Other Services       Precautions / Restrictions Precautions Precautions: Fall Restrictions Weight Bearing Restrictions: Yes LLE Weight Bearing: Weight bearing as tolerated      Mobility  Bed Mobility Overal bed mobility: Needs Assistance Bed Mobility: Supine to Sit     Supine to sit: +2 for physical assistance;Max assist        Transfers Overall transfer level: Needs assistance Equipment used: Rolling walker (2 wheeled) Transfers: Sit to/from [employer] Sit to Stand: Max assist;+2 physical assistance Stand pivot transfers: Max assist       General transfer comment: unable to reach standing during sit to stand transfer, thus returnt to EOB and performed stand pivot transfer to chair.  Ambulation/Gait                Stairs            Wheelchair Mobility    Modified Rankin (Stroke Patients Only)       Balance Overall balance assessment: Needs assistance Sitting-balance support: No upper extremity supported;Feet supported Sitting balance-Leahy Scale: Poor Sitting balance - Comments: required min to mod assist to maintain upright sitting on EOB                                     Pertinent Vitals/Pain Pain Assessment: Faces Faces Pain Scale:  Hurts a little bit Pain Location: unable to determine Pain Intervention(s): Monitored during session    Home Living Family/patient expects to be discharged to:: Skilled nursing facility                      Prior Function Level of Independence: Needs assistance   Gait / Transfers Assistance Needed: per wife, ambulating with assistance and rolling walker at SNF.           Hand Dominance        Extremity/Trunk Assessment   Upper Extremity Assessment: Difficult to assess due to impaired cognition           Lower Extremity Assessment: Difficult to assess due to impaired cognition         Communication   Communication: Expressive difficulties  Cognition Arousal/Alertness: Lethargic Behavior During Therapy: Flat affect Overall Cognitive Status: History of cognitive impairments - at baseline                      General Comments      Exercises        Assessment/Plan    PT Assessment Patient needs continued PT services  PT Diagnosis Generalized weakness;Difficulty walking   PT Problem List Decreased strength;Decreased activity tolerance;Decreased range of motion;Decreased balance;Decreased mobility;Decreased cognition;Decreased knowledge of use of DME  PT Treatment Interventions DME instruction;Gait training;Functional mobility training;Therapeutic activities;Therapeutic exercise;Balance training;Patient/family education   PT  Goals (Current goals can be found in the Care Plan section) Acute Rehab PT Goals Patient Stated Goal: none stated PT Goal Formulation: With family Time For Goal Achievement: 05/27/15 Potential to Achieve Goals: Good    Frequency Min 3X/week   Barriers to discharge        Co-evaluation               End of Session   Activity Tolerance: Patient tolerated treatment well Patient left: in chair;with chair alarm set;with call bell/phone within reach;with family/visitor present Nurse Communication: Mobility  status         Time: 1340-1405 PT Time Calculation (min) (ACUTE ONLY): 25 min   Charges:   PT Evaluation $Initial PT Evaluation Tier I: 1 Procedure PT Treatments $Therapeutic Activity: 8-22 mins   PT G CodesOlivia Canter:        Tyree Vandruff M 05/13/2015, 2:17 PM  05/13/2015 Corlis HoveMargie Hatem Cull, PT [phone]

## 2015-05-13 NOTE — Progress Notes (Signed)
OT Cancellation Note  Patient Details Name: Keith MouldsRobert Dinovo MRN: [id number] DOB: Apr 23, 1933   Cancelled Treatment:    Reason Eval/Treat Not Completed: Other (comment) Pt is from SNF and has Medicare and current D/C plan is SNF. No apparent immediate acute care OT needs, therefore will defer OT to SNF. If OT eval is needed please call Acute Rehab Dept. at [phone] or text page OT at [phone].    Nena JordanMiller, Shadman Tozzi M  Carney LivingLeeAnn Marie Salle Brandle, OTR/L Occupational Therapist [phone] (pager)  05/13/2015, 2:44 PM

## 2015-05-13 NOTE — Progress Notes (Signed)
Pt more alert this evening. He is pulling off the pulse ox, nasal cannula, and bandages. Continues to have tremors in hands. Starts moaning when he appears to be in pain. Is oriented to person only.

## 2015-05-14 ENCOUNTER — Encounter (HOSPITAL_COMMUNITY): Payer: Self-pay | Admitting: Orthopaedic Surgery

## 2015-05-14 ENCOUNTER — Inpatient Hospital Stay (HOSPITAL_COMMUNITY): Payer: Medicare Other

## 2015-05-14 DIAGNOSIS — D72829 Elevated white blood cell count, unspecified: Secondary | ICD-10-CM

## 2015-05-14 DIAGNOSIS — D62 Acute posthemorrhagic anemia: Secondary | ICD-10-CM

## 2015-05-14 LAB — BASIC METABOLIC PANEL
ANION GAP: 8 (ref 5–15)
BUN: 20 mg/dL (ref 6–20)
CALCIUM: 7.9 mg/dL — AB (ref 8.9–10.3)
CHLORIDE: 105 mmol/L (ref 101–111)
CO2: 23 mmol/L (ref 22–32)
CREATININE: 0.69 mg/dL (ref 0.61–1.24)
GFR calc Af Amer: >60 mL/min (ref >60)
GFR calc non Af Amer: >60 mL/min (ref >60)
GLUCOSE: 102 mg/dL — AB (ref 65–99)
POTASSIUM: 4.1 mmol/L (ref 3.5–5.1)
SODIUM: 136 mmol/L (ref 135–145)

## 2015-05-14 LAB — CBC
HEMATOCRIT: 29.2 % — AB (ref 39.0–52.0)
Hemoglobin: 9.8 g/dL — ABNORMAL LOW (ref 13.0–17.0)
MCH: 31.8 pg (ref 26.0–34.0)
MCHC: 33.6 g/dL (ref 30.0–36.0)
MCV: 94.8 fL (ref 78.0–100.0)
PLATELETS: 150 10*3/uL (ref 150–400)
RBC: 3.08 MIL/uL — ABNORMAL LOW (ref 4.22–5.81)
RDW: 13.4 % (ref 11.5–15.5)
WBC: 11.6 10*3/uL — AB (ref 4.0–10.5)

## 2015-05-14 NOTE — Progress Notes (Signed)
   Subjective:  Patient is comfortable  Objective:   VITALS:   Filed Vitals:   05/13/15 1300 05/13/15 2105 05/14/15 0035 05/14/15 0557  BP: 122/57 134/78  146/76  Pulse: 96 94  73  Temp: 99.2 F (37.3 C) 98.7 F (37.1 C)  98.2 F (36.8 C)  TempSrc:  Oral    Resp: 18 18  16   Height:      SpO2: 95% 95% 94% 94%    Dressing c/d/i   Lab Results  Component Value Date   WBC 11.6* 05/14/2015   HGB 9.8* 05/14/2015   HCT 29.2* 05/14/2015   MCV 94.8 05/14/2015   PLT 150 05/14/2015     Assessment/Plan:  2 Days Post-Op   - Expected postop acute blood loss anemia - will monitor for symptoms - Up with PT/OT - DVT ppx - SCDs, ambulation, lovenox - WBAT left lower extremity - Pain control - Discharge planning  Cheral AlmasXu, Aamari West Michael 05/14/2015, 7:45 AM [phone]

## 2015-05-14 NOTE — Progress Notes (Signed)
[facility] PROGRESS NOTE  Keith Francis JYN:[id number] DOB: March 05, 1933 DOA: 05/11/2015 PCP: Rogelia Boga, MD  Assessment/Plan: #1 left femoral neck fracture Secondary to mechanical fall. Patient status post prosthetic replacement of femoral neck fracture 05/12/2015 per Dr.Xu. PT/OT. Per orthopedics. Needs skilled nursing facility.  #2 Parkinson's disease Continue Sinemet and rivastigmine  #3 hypertension Stable. Follow.  #4 hyperlipidemia Continue statin.  #5 Leukocytosis Likely reactive. Check UA with c and s. Repat CXR negative. Follow.  #6. Postoperative Acute blood loss anemia Follow H&H.  #7 prophylaxis SCDs for DVT prophylaxis.   Code Status: Full Family Communication: No family at bedside.  Disposition Plan: SNF when medically stable, hopefully tomorrow.   Consultants:  Orthopedics: Dr.Xu 05/12/2015  Procedures: Prosthetic replacement for femoral neck fracture X-ray of the pelvis 05/12/2015  Antibiotics:  None  HPI/Subjective: Patient sleeping comfortably.  Objective: Filed Vitals:   05/14/15 1300  BP: 119/62  Pulse: 87  Temp: 98.8 F (37.1 C)  Resp: 18    Intake/Output Summary (Last 24 hours) at 05/14/15 1503 Last data filed at 05/14/15 1300  Gross per 24 hour  Intake 1650.5 ml  Output      0 ml  Net 1650.5 ml   There were no vitals filed for this visit.  Exam:   General:  NAD.Sleeping   Cardiovascular: Regular rate and rhythm  Respiratory: Clear to auscultation bilaterally anterior lung fields  Abdomen: Soft, nontender, nondistended, positive bowel sounds.  Musculoskeletal: No clubbing cyanosis or edema.  Data Reviewed: Basic Metabolic Panel:  Recent Labs Lab 05/11/15 2046 05/12/15 0900 05/13/15 0430 05/14/15 0430  NA 140 139 137 136  K 3.7 3.9 3.9 4.1  CL 105 105 105 105  CO2 GLUCOSE 102* 89 105* 102*  BUN 26* CREATININE 0.83 0.64 0.68 0.69  CALCIUM 8.5* 8.4* 8.0* 7.9*    Liver Function Tests:  Recent Labs Lab 05/11/15 2046  AST 19  ALT <5*  ALKPHOS 54  BILITOT 0.5  PROT 6.3*  ALBUMIN 3.2*   No results for input(s): LIPASE, AMYLASE in the last 168 hours. No results for input(s): AMMONIA in the last 168 hours. CBC:  Recent Labs Lab 05/11/15 2046 05/12/15 0900 05/13/15 0430 05/14/15 0430  WBC 12.1* 9.9 9.9 11.6*  NEUTROABS 9.8* 8.2*  --   --   HGB 11.5* 11.3* 11.0* 9.8*  HCT 35.9* 34.9* 33.8* 29.2*  MCV 96.8 95.1 96.3 94.8  PLT 172 188 144* 150   Cardiac Enzymes: No results for input(s): CKTOTAL, CKMB, CKMBINDEX, TROPONINI in the last 168 hours. BNP (last 3 results) No results for input(s): BNP in the last 8760 hours.  ProBNP (last 3 results) No results for input(s): PROBNP in the last 8760 hours.  CBG: No results for input(s): GLUCAP in the last 168 hours.  Recent Results (from the past 240 hour(s))  Surgical pcr screen     Status: Abnormal   Collection Time: 05/12/15  4:19 AM  Result Value Ref Range Status   MRSA, PCR NEGATIVE NEGATIVE Final   Staphylococcus aureus POSITIVE (A) NEGATIVE Final    Comment:        The Xpert SA Assay (FDA approved for NASAL specimens in patients over 45 years of age), is one component of a comprehensive surveillance program.  Test performance has been validated by [facility] for patients greater than or equal to 8 year old. It is not intended to diagnose infection nor to guide or monitor treatment.  Studies: Pelvis Portable  05/12/2015   CLINICAL DATA:  Postoperative evaluation status post left hip replacement.  EXAM: PORTABLE PELVIS 1-2 VIEWS  COMPARISON:  Hip radiograph 05/11/2015  FINDINGS: Patient status post left hip hemiarthroplasty. Hardware appears in appropriate position. Gas within the overlying soft tissues compatible postoperative state. Overlying surgical staple line.  IMPRESSION: Satisfactory appearance left hip hardware.   Electronically Signed   By: Annia Beltrew  Davis M.D.    On: 05/12/2015 18:49   Dg Chest Port 1 View  05/14/2015   CLINICAL DATA:  Initial encounter for preoperative assessment for hip fracture  EXAM: PORTABLE CHEST - 1 VIEW  COMPARISON:  04/07/2015.  FINDINGS: 0757 hours. The lungs are clear without focal infiltrate, edema, pneumothorax or pleural effusion. Interstitial markings are diffusely coarsened with chronic features. Cardiopericardial silhouette is at upper limits of normal for size. Patient is status post CABG. Bones are diffusely demineralized.  IMPRESSION: Stable.  No acute cardiopulmonary findings.   Electronically Signed   By: Kennith CenterEric  Mansell M.D.   On: 05/14/2015 08:09    Scheduled Meds: . aspirin  325 mg Oral BID  . atorvastatin  20 mg Oral q1800  . carbidopa-levodopa  1 tablet Oral 4 times per day  . feeding supplement (PRO-STAT SUGAR FREE 64)  30 mL Oral BID  . rivastigmine  3 mg Oral BID   Continuous Infusions: . sodium chloride 20 mL/hr at 05/13/15 0600  . lactated ringers Stopped (05/14/15 0256)    Principal Problem:   Closed left hip fracture Active Problems:   PARKINSON'S DISEASE   Essential hypertension   Protein-calorie malnutrition, severe   Parkinson disease    Time spent: 35 minutes    Giovana Faciane M.D. [facility] Pager [phone]. If 7PM-7AM, please contact night-coverage at [email or web address], password [email or web address] 05/14/2015, 3:03 PM  LOS: 3 days

## 2015-05-15 DIAGNOSIS — W19XXXA Unspecified fall, initial encounter: Secondary | ICD-10-CM | POA: Insufficient documentation

## 2015-05-15 DIAGNOSIS — W19XXXD Unspecified fall, subsequent encounter: Secondary | ICD-10-CM

## 2015-05-15 LAB — BASIC METABOLIC PANEL
Anion gap: 7 (ref 5–15)
BUN: 16 mg/dL (ref 6–20)
CO2: 25 mmol/L (ref 22–32)
Calcium: 7.9 mg/dL — ABNORMAL LOW (ref 8.9–10.3)
Chloride: 106 mmol/L (ref 101–111)
Creatinine, Ser: 0.61 mg/dL (ref 0.61–1.24)
GFR calc Af Amer: >60 mL/min (ref >60)
GFR calc non Af Amer: >60 mL/min (ref >60)
GLUCOSE: 97 mg/dL (ref 65–99)
POTASSIUM: 3.6 mmol/L (ref 3.5–5.1)
SODIUM: 138 mmol/L (ref 135–145)

## 2015-05-15 LAB — CBC
HCT: 28.7 % — ABNORMAL LOW (ref 39.0–52.0)
Hemoglobin: 9.6 g/dL — ABNORMAL LOW (ref 13.0–17.0)
MCH: 31.8 pg (ref 26.0–34.0)
MCHC: 33.4 g/dL (ref 30.0–36.0)
MCV: 95 fL (ref 78.0–100.0)
Platelets: 184 10*3/uL (ref 150–400)
RBC: 3.02 MIL/uL — AB (ref 4.22–5.81)
RDW: 13.1 % (ref 11.5–15.5)
WBC: 9.6 10*3/uL (ref 4.0–10.5)

## 2015-05-15 NOTE — Discharge Planning (Signed)
[facility]. Patient's wife updated regarding discharge.  [facility] RN report number: [phone] Transportation: EMS ([address])  Marcelline Deistmily Una Yeomans, [place] - [phone] Clinical Social Work Department Orthopedics [phone]([id number]) and Surgical [phone]([id number])

## 2015-05-15 NOTE — Progress Notes (Signed)
Called report to [facility]. Gave report to RN

## 2015-05-15 NOTE — Progress Notes (Signed)
Patient is stable from ortho standpoint for dc  N. Glee ArvinMichael Shalonda Sachse, MD [facility] [phone] 8:03 AM

## 2015-05-15 NOTE — Progress Notes (Signed)
Physical Therapy Treatment Patient Details Name: Rachel MouldsRobert Lukes MRN: [id number] DOB: Apr 03, 1933 Today's Date: 05/15/2015    History of Present Illness Patient admitted s/p fall with resulting hip fx.  Underwent direct anterior hemiarthroplasty 05/12/15.  Patient with history of parkinsons, dementia.    PT Comments    Patient limited by above but able to transfer to recliner today with 2 assist. Wife present throughout and very thankful for assistance. Continue to recommend SNF for ongoing Physical Therapy.     Follow Up Recommendations  SNF     Equipment Recommendations  None recommended by PT    Recommendations for Other Services       Precautions / Restrictions Precautions Precautions: Fall Restrictions LLE Weight Bearing: Weight bearing as tolerated    Mobility  Bed Mobility Overal bed mobility: Needs Assistance Bed Mobility: Supine to Sit     Supine to sit: +2 for physical assistance;Max assist     General bed mobility comments: A with all aspects of transfer  Transfers Overall transfer level: Needs assistance Equipment used: Rolling walker (2 wheeled)   Sit to Stand: Max assist;+2 physical assistance Stand pivot transfers: +2 physical assistance;Mod assist       General transfer comment: A with all aspects of transfer. Use of chuck pad for transferring patient to recliner.   Ambulation/Gait                 Stairs            Wheelchair Mobility    Modified Rankin (Stroke Patients Only)       Balance       Sitting balance - Comments: required min to mod assist to maintain upright sitting on EOB. Patient with heavy posterior lean in sitting.                             Cognition Arousal/Alertness: Awake/alert Behavior During Therapy: Flat affect Overall Cognitive Status: History of cognitive impairments - at baseline                      Exercises      General Comments        Pertinent Vitals/Pain  Faces Pain Scale: Hurts a little bit Pain Location: slightly grimaces with movement Pain Intervention(s): Monitored during session    Home Living                      Prior Function            PT Goals (current goals can now be found in the care plan section) Progress towards PT goals: Progressing toward goals    Frequency  Min 3X/week    PT Plan Current plan remains appropriate    Co-evaluation             End of Session   Activity Tolerance: Patient tolerated treatment well Patient left: in chair;with call bell/phone within reach;with family/visitor present;with chair alarm set     Time: [id number] PT Time Calculation (min) (ACUTE ONLY): 21 min  Charges:  $Therapeutic Activity: 8-22 mins                    G Codes:      Fredrich BirksRobinette, Emilene Roma Elizabeth 05/15/2015, 11:58 AM 05/15/2015 Fredrich Birksobinette, Kyen Taite Elizabeth PTA [phone] pager [phone] office

## 2015-05-15 NOTE — Discharge Summary (Signed)
Physician Discharge Summary  Keith Francis ZOX:[id number] DOB: 07-14-1933 DOA: 05/11/2015  PCP: Rogelia Boga, MD  Admit date: 05/11/2015 Discharge date: 05/15/2015  Time spent: 65 minutes  Recommendations for Outpatient Follow-up:  1. Follow-up with Dr. Roda Shutters of orthopedics in 2 weeks. 2. Follow-up with M.D. at skilled nursing facility or Rogelia Boga, MD in 1 week. All follow-up patient need a basic metabolic profile done to follow-up on electrolytes and renal function. Patient also need a CBC done to follow-up on his hemoglobin.  Discharge Diagnoses:  Principal Problem:   Closed left hip fracture Active Problems:   PARKINSON'S DISEASE   Essential hypertension   Protein-calorie malnutrition, severe   Parkinson disease   Leukocytosis   Postoperative anemia due to acute blood loss   Fall   Discharge Condition: Stable and improved  Diet recommendation: Regular. Mechanical soft  There were no vitals filed for this visit.  History of present illness:  Per Dr Marcell Anger is a 81 y.o. male brought in from his SNF by EMS after x ray at the SNF showed L hip fracture. Fall apparently was 3 days prior to admission on 5/17. Began to complain of hip pain on the day of admission. X rays performed at [facility] demonstrating left subcapital femoral neck fracture.  Hospital Course:  #1 left femoral neck fracture Secondary to mechanical fall. Patient was admitted and seen in consultation by orthopedics, Dr. Roda Shutters. Patient subsequently underwent prosthetic replacement of femoral neck fracture 05/12/2015 per Dr.Xu, without any complications. Patient was seen by physical therapy. Patient remained in stable condition. Patient be discharged to a skilled nursing facility in stable and improved condition. Patient be discharged on aspirin 325 mg twice daily for DVT prophylaxis per orthopedics. Patient will follow-up with Dr Roda Shutters, orthopedics in 2 weeks.  #2 Parkinson's  disease Continued on home regimen of Sinemet and rivastigmine  #3 hypertension Stable. Follow.  #4 hyperlipidemia Continued on a statin.  #5 Leukocytosis Likely reactive. Urinalysis which was done was negative. Chest x-ray which was that was negative for any acute infiltrate. Leukocytosis trended down and had resolved by day of discharge.   #6. Postoperative Acute blood loss anemia Postoperatively patient noted to be anemic. Patient with no overt bleeding. Hemoglobin stabilized at 9.6. Outpatient follow-up.  #7 severe protein calorie malnutrition Patient was maintained on nutritional supplementation.   Procedures: Prosthetic replacement for femoral neck fracture X-ray of the pelvis 05/12/2015  Consultations: Orthopedics: Dr.Xu 05/12/2015  Discharge Exam: Filed Vitals:   05/15/15 1300  BP: 93/53  Pulse: 83  Temp: 98.5 F (36.9 C)  Resp: 18    General: NAD Cardiovascular: RRR Respiratory: CTAB anterior lung fields.  Discharge Instructions   Discharge Instructions    Diet general    Complete by:  As directed   Soft diet     Discharge instructions    Complete by:  As directed   Follow up with Dr Roda Shutters orthopedics in 2 weeks. Follow up with MD at SNF.     Increase activity slowly    Complete by:  As directed      Weight bearing as tolerated    Complete by:  As directed           Current Discharge Medication List    START taking these medications   Details  aspirin EC 325 MG tablet Take 1 tablet (325 mg total) by mouth 2 (two) times daily. Qty: 84 tablet, Refills: 0    HYDROcodone-acetaminophen (NORCO) 7.5-325 MG per tablet Take 1-2  tablets by mouth every 6 (six) hours as needed for moderate pain. Qty: 90 tablet, Refills: 0      CONTINUE these medications which have NOT CHANGED   Details  acetaminophen (TYLENOL) 325 MG tablet Take 2 tablets (650 mg total) by mouth every 12 (twelve) hours as needed.    Alum & Mag Hydroxide-Simeth (MAGIC MOUTHWASH) SOLN  Take 5 mLs by mouth 3 (three) times daily.    Amino Acids-Protein Hydrolys (FEEDING SUPPLEMENT, PRO-STAT SUGAR FREE 64,) LIQD Take 30 mLs by mouth 3 (three) times daily with meals.    Ascorbic Acid (VITAMIN C) 500 MG tablet Take 500 mg by mouth daily.      atorvastatin (LIPITOR) 20 MG tablet Take 20 mg by mouth daily at 6 PM.     carbidopa-levodopa (SINEMET CR) 50-200 MG per tablet Take 1 tablet by mouth 4 (four) times daily. Take 1 Tablet at 8 am, Take 1 Tablet at 12 pm , Take 1 Tablet at 4 pm, Take 1 Tablet at 8 pm.    rivastigmine (EXELON) 3 MG capsule Take 3 mg by mouth 2 (two) times daily.      STOP taking these medications     aspirin 81 MG chewable tablet        No Known Allergies Follow-up Information    Follow up with Cheral AlmasXu, Naiping Michael, MD In 2 weeks.   Specialty:  Orthopedic Surgery   Why:  For suture removal, For wound re-check   Contact information:   [address] [place] [id number] [phone]       Please follow up.   Why:  f/u with MD at SNF       The results of significant diagnostics from this hospitalization (including imaging, microbiology, ancillary and laboratory) are listed below for reference.    Significant Diagnostic Studies: Pelvis Portable  05/12/2015   CLINICAL DATA:  Postoperative evaluation status post left hip replacement.  EXAM: PORTABLE PELVIS 1-2 VIEWS  COMPARISON:  Hip radiograph 05/11/2015  FINDINGS: Patient status post left hip hemiarthroplasty. Hardware appears in appropriate position. Gas within the overlying soft tissues compatible postoperative state. Overlying surgical staple line.  IMPRESSION: Satisfactory appearance left hip hardware.   Electronically Signed   By: Annia Beltrew  Davis M.D.   On: 05/12/2015 18:49   Dg Chest Port 1 View  05/14/2015   CLINICAL DATA:  Initial encounter for preoperative assessment for hip fracture  EXAM: PORTABLE CHEST - 1 VIEW  COMPARISON:  04/07/2015.  FINDINGS: 0757 hours. The lungs are clear  without focal infiltrate, edema, pneumothorax or pleural effusion. Interstitial markings are diffusely coarsened with chronic features. Cardiopericardial silhouette is at upper limits of normal for size. Patient is status post CABG. Bones are diffusely demineralized.  IMPRESSION: Stable.  No acute cardiopulmonary findings.   Electronically Signed   By: Kennith CenterEric  Mansell M.D.   On: 05/14/2015 08:09   Dg Hip Operative Unilat With Pelvis Left  05/12/2015   CLINICAL DATA:  Closed left hip fracture.  EXAM: OPERATIVE LEFT HIP (WITH PELVIS IF PERFORMED) 4 VIEWS  TECHNIQUE: Fluoroscopic spot image(s) were submitted for interpretation post-operatively.  FLUOROSCOPY TIME:  Radiation Exposure Index (as provided by the fluoroscopic device): Not applicable  If the device does not provide the exposure index:  Fluoroscopy Time:  0 minutes 19 seconds  Number of Acquired Images:  4  COMPARISON:  None.  FINDINGS: The two initial images demonstrate a nondisplaced left femoral neck fracture. Two subsequent images demonstrate a left hip prosthesis in  satisfactory position and alignment. No fracture or dislocation seen on those images.  IMPRESSION: Left hip prosthesis in satisfactory position and alignment.   Electronically Signed   By: Beckie Salts M.D.   On: 05/12/2015 13:00   Dg Hip Unilat With Pelvis 2-3 Views Left  05/11/2015   CLINICAL DATA:  fall on 5/17. Pt was not evaluated at that time, today began to c/o right hip pain  EXAM: LEFT HIP (WITH PELVIS) 2-3 VIEWS  COMPARISON:  None.  FINDINGS: Subcapital left femoral neck fracture, displaced at least 12 mm. Bony pelvis intact. Advanced degenerative changes in the visualized lower lumbar spine.  IMPRESSION: 1. Left subcapital femoral neck fracture.   Electronically Signed   By: Corlis Leak M.D.   On: 05/11/2015 20:53    Microbiology: Recent Results (from the past 240 hour(s))  Surgical pcr screen     Status: Abnormal   Collection Time: 05/12/15  4:19 AM  Result Value Ref  Range Status   MRSA, PCR NEGATIVE NEGATIVE Final   Staphylococcus aureus POSITIVE (A) NEGATIVE Final    Comment:        The Xpert SA Assay (FDA approved for NASAL specimens in patients over 9 years of age), is one component of a comprehensive surveillance program.  Test performance has been validated by [facility] for patients greater than or equal to 72 year old. It is not intended to diagnose infection nor to guide or monitor treatment.      Labs: Basic Metabolic Panel:  Recent Labs Lab 05/11/15 2046 05/12/15 0900 05/13/15 0430 05/14/15 0430 05/15/15 0525  NA 140 139 137 136 138  K 3.7 3.9 3.9 4.1 3.6  CL 105 105 105 105 106  CO2 GLUCOSE 102* 89 105* 102* 97  BUN 26* CREATININE 0.83 0.64 0.68 0.69 0.61  CALCIUM 8.5* 8.4* 8.0* 7.9* 7.9*   Liver Function Tests:  Recent Labs Lab 05/11/15 2046  AST 19  ALT <5*  ALKPHOS 54  BILITOT 0.5  PROT 6.3*  ALBUMIN 3.2*   No results for input(s): LIPASE, AMYLASE in the last 168 hours. No results for input(s): AMMONIA in the last 168 hours. CBC:  Recent Labs Lab 05/11/15 2046 05/12/15 0900 05/13/15 0430 05/14/15 0430 05/15/15 0525  WBC 12.1* 9.9 9.9 11.6* 9.6  NEUTROABS 9.8* 8.2*  --   --   --   HGB 11.5* 11.3* 11.0* 9.8* 9.6*  HCT 35.9* 34.9* 33.8* 29.2* 28.7*  MCV 96.8 95.1 96.3 94.8 95.0  PLT 172 188 144* 150 184   Cardiac Enzymes: No results for input(s): CKTOTAL, CKMB, CKMBINDEX, TROPONINI in the last 168 hours. BNP: BNP (last 3 results) No results for input(s): BNP in the last 8760 hours.  ProBNP (last 3 results) No results for input(s): PROBNP in the last 8760 hours.  CBG: No results for input(s): GLUCAP in the last 168 hours.     SignedRamiro Harvest MD [facility] 05/15/2015, 3:31 PM

## 2015-05-16 ENCOUNTER — Ambulatory Visit: Payer: No Typology Code available for payment source | Admitting: Cardiovascular Disease

## 2015-08-16 ENCOUNTER — Encounter (HOSPITAL_COMMUNITY): Payer: Self-pay

## 2015-08-16 ENCOUNTER — Inpatient Hospital Stay (HOSPITAL_COMMUNITY)
Admission: EM | Admit: 2015-08-16 | Discharge: 2015-08-23 | DRG: 871 | Disposition: E | Attending: Internal Medicine | Admitting: Internal Medicine

## 2015-08-16 ENCOUNTER — Emergency Department (HOSPITAL_COMMUNITY)

## 2015-08-16 DIAGNOSIS — E872 Acidosis, unspecified: Secondary | ICD-10-CM | POA: Diagnosis present

## 2015-08-16 DIAGNOSIS — Z96653 Presence of artificial knee joint, bilateral: Secondary | ICD-10-CM | POA: Diagnosis present

## 2015-08-16 DIAGNOSIS — Z66 Do not resuscitate: Secondary | ICD-10-CM | POA: Diagnosis present

## 2015-08-16 DIAGNOSIS — Z96642 Presence of left artificial hip joint: Secondary | ICD-10-CM | POA: Diagnosis present

## 2015-08-16 DIAGNOSIS — J9601 Acute respiratory failure with hypoxia: Secondary | ICD-10-CM | POA: Diagnosis present

## 2015-08-16 DIAGNOSIS — A419 Sepsis, unspecified organism: Principal | ICD-10-CM | POA: Diagnosis present

## 2015-08-16 DIAGNOSIS — G20A1 Parkinson's disease without dyskinesia, without mention of fluctuations: Secondary | ICD-10-CM | POA: Diagnosis present

## 2015-08-16 DIAGNOSIS — I10 Essential (primary) hypertension: Secondary | ICD-10-CM | POA: Diagnosis present

## 2015-08-16 DIAGNOSIS — M8580 Other specified disorders of bone density and structure, unspecified site: Secondary | ICD-10-CM | POA: Diagnosis present

## 2015-08-16 DIAGNOSIS — Z7401 Bed confinement status: Secondary | ICD-10-CM

## 2015-08-16 DIAGNOSIS — Z8249 Family history of ischemic heart disease and other diseases of the circulatory system: Secondary | ICD-10-CM

## 2015-08-16 DIAGNOSIS — Z515 Encounter for palliative care: Secondary | ICD-10-CM | POA: Diagnosis not present

## 2015-08-16 DIAGNOSIS — R5383 Other fatigue: Secondary | ICD-10-CM | POA: Diagnosis not present

## 2015-08-16 DIAGNOSIS — R6521 Severe sepsis with septic shock: Secondary | ICD-10-CM | POA: Diagnosis present

## 2015-08-16 DIAGNOSIS — E43 Unspecified severe protein-calorie malnutrition: Secondary | ICD-10-CM | POA: Diagnosis present

## 2015-08-16 DIAGNOSIS — Z951 Presence of aortocoronary bypass graft: Secondary | ICD-10-CM

## 2015-08-16 DIAGNOSIS — I252 Old myocardial infarction: Secondary | ICD-10-CM | POA: Diagnosis not present

## 2015-08-16 DIAGNOSIS — Z7189 Other specified counseling: Secondary | ICD-10-CM | POA: Diagnosis not present

## 2015-08-16 DIAGNOSIS — G20 Parkinson's disease: Secondary | ICD-10-CM | POA: Diagnosis present

## 2015-08-16 DIAGNOSIS — H540 Blindness, both eyes: Secondary | ICD-10-CM | POA: Diagnosis present

## 2015-08-16 DIAGNOSIS — R579 Shock, unspecified: Secondary | ICD-10-CM | POA: Diagnosis not present

## 2015-08-16 DIAGNOSIS — I2510 Atherosclerotic heart disease of native coronary artery without angina pectoris: Secondary | ICD-10-CM | POA: Diagnosis present

## 2015-08-16 DIAGNOSIS — B952 Enterococcus as the cause of diseases classified elsewhere: Secondary | ICD-10-CM | POA: Diagnosis present

## 2015-08-16 DIAGNOSIS — E785 Hyperlipidemia, unspecified: Secondary | ICD-10-CM | POA: Diagnosis present

## 2015-08-16 DIAGNOSIS — D72829 Elevated white blood cell count, unspecified: Secondary | ICD-10-CM | POA: Diagnosis present

## 2015-08-16 DIAGNOSIS — B9620 Unspecified Escherichia coli [E. coli] as the cause of diseases classified elsewhere: Secondary | ICD-10-CM | POA: Diagnosis present

## 2015-08-16 DIAGNOSIS — N179 Acute kidney failure, unspecified: Secondary | ICD-10-CM | POA: Diagnosis present

## 2015-08-16 DIAGNOSIS — R4182 Altered mental status, unspecified: Secondary | ICD-10-CM | POA: Diagnosis present

## 2015-08-16 DIAGNOSIS — F0390 Unspecified dementia without behavioral disturbance: Secondary | ICD-10-CM | POA: Diagnosis present

## 2015-08-16 LAB — CBC WITH DIFFERENTIAL/PLATELET
BASOS PCT: 0 % (ref 0–1)
Basophils Absolute: 0 10*3/uL (ref 0.0–0.1)
EOS ABS: 0 10*3/uL (ref 0.0–0.7)
Eosinophils Relative: 0 % (ref 0–5)
HEMATOCRIT: 33.3 % — AB (ref 39.0–52.0)
HEMOGLOBIN: 10.3 g/dL — AB (ref 13.0–17.0)
LYMPHS PCT: 3 % — AB (ref 12–46)
Lymphs Abs: 1.1 10*3/uL (ref 0.7–4.0)
MCH: 30.7 pg (ref 26.0–34.0)
MCHC: 30.9 g/dL (ref 30.0–36.0)
MCV: 99.4 fL (ref 78.0–100.0)
Monocytes Absolute: 1.8 10*3/uL — ABNORMAL HIGH (ref 0.1–1.0)
Monocytes Relative: 5 % (ref 3–12)
NEUTROS ABS: 32.7 10*3/uL — AB (ref 1.7–7.7)
Neutrophils Relative %: 92 % — ABNORMAL HIGH (ref 43–77)
PLATELETS: 331 10*3/uL (ref 150–400)
RBC: 3.35 MIL/uL — ABNORMAL LOW (ref 4.22–5.81)
RDW: 14.8 % (ref 11.5–15.5)
WBC: 35.6 10*3/uL — ABNORMAL HIGH (ref 4.0–10.5)

## 2015-08-16 LAB — COMPREHENSIVE METABOLIC PANEL
ALK PHOS: 97 U/L (ref 38–126)
ALT: 5 U/L — AB (ref 17–63)
AST: 39 U/L (ref 15–41)
Albumin: 3.6 g/dL (ref 3.5–5.0)
Anion gap: 21 — ABNORMAL HIGH (ref 5–15)
BUN: 45 mg/dL — ABNORMAL HIGH (ref 6–20)
CALCIUM: 9.3 mg/dL (ref 8.9–10.3)
CO2: 16 mmol/L — ABNORMAL LOW (ref 22–32)
CREATININE: 1.94 mg/dL — AB (ref 0.61–1.24)
Chloride: 107 mmol/L (ref 101–111)
GFR calc Af Amer: 36 mL/min — ABNORMAL LOW (ref >60)
GFR, EST NON AFRICAN AMERICAN: 31 mL/min — AB (ref >60)
Glucose, Bld: 103 mg/dL — ABNORMAL HIGH (ref 65–99)
Potassium: 4.5 mmol/L (ref 3.5–5.1)
Sodium: 144 mmol/L (ref 135–145)
Total Bilirubin: 0.6 mg/dL (ref 0.3–1.2)
Total Protein: 7.1 g/dL (ref 6.5–8.1)

## 2015-08-16 LAB — BLOOD GAS, ARTERIAL
Acid-base deficit: 10.9 mmol/L — ABNORMAL HIGH (ref 0.0–2.0)
BICARBONATE: 12.5 meq/L — AB (ref 20.0–24.0)
Drawn by: 257701
FIO2: 1
O2 Saturation: 96.9 %
PCO2 ART: 21.7 mmHg — AB (ref 35.0–45.0)
PH ART: 7.378 (ref 7.350–7.450)
Patient temperature: 98.2
TCO2: 11.4 mmol/L (ref 0–100)
pO2, Arterial: 108 mmHg — ABNORMAL HIGH (ref 80.0–100.0)

## 2015-08-16 LAB — TROPONIN I: TROPONIN I: 0.06 ng/mL — AB (ref <0.031)

## 2015-08-16 LAB — APTT: aPTT: 29 seconds (ref 24–37)

## 2015-08-16 LAB — LACTIC ACID, PLASMA: LACTIC ACID, VENOUS: 10.7 mmol/L — AB (ref 0.5–2.0)

## 2015-08-16 MED ORDER — LORAZEPAM 2 MG/ML IJ SOLN: 1.0000 mg | Freq: Three times a day (TID) | INTRAMUSCULAR | Status: DC

## 2015-08-16 MED ORDER — SODIUM CHLORIDE 0.9 % IV SOLN: 1000.0000 mL | INTRAVENOUS | Status: DC

## 2015-08-16 MED ORDER — MORPHINE BOLUS VIA INFUSION
4.0000 mg | INTRAVENOUS | Status: DC | PRN
  Administered 2015-08-17 (×2): 4 mg via INTRAVENOUS
  Filled 2015-08-16 (×3): qty 4

## 2015-08-16 MED ORDER — SODIUM CHLORIDE 0.9 % IV SOLN
5.0000 mg/h | INTRAVENOUS | Status: DC
  Administered 2015-08-16: 5 mg/h via INTRAVENOUS
  Administered 2015-08-16: 10 mg/h via INTRAVENOUS
  Administered 2015-08-16 – 2015-08-18 (×2): 5 mg/h via INTRAVENOUS
  Filled 2015-08-16 (×4): qty 10

## 2015-08-16 MED ORDER — SODIUM CHLORIDE 0.9 % IV SOLN
1000.0000 mL | Freq: Once | INTRAVENOUS | Status: AC
  Administered 2015-08-16: 1000 mL via INTRAVENOUS

## 2015-08-16 MED ORDER — PIPERACILLIN-TAZOBACTAM 3.375 G IVPB 30 MIN
3.3750 g | Freq: Once | INTRAVENOUS | Status: AC
  Administered 2015-08-16: 3.375 g via INTRAVENOUS
  Filled 2015-08-16: qty 50

## 2015-08-16 MED ORDER — VANCOMYCIN HCL IN DEXTROSE 1-5 GM/200ML-% IV SOLN
1000.0000 mg | Freq: Once | INTRAVENOUS | Status: DC
  Administered 2015-08-16: 1000 mg via INTRAVENOUS
  Filled 2015-08-16: qty 200

## 2015-08-16 MED ORDER — ARTIFICIAL TEARS OP OINT
TOPICAL_OINTMENT | OPHTHALMIC | Status: DC | PRN
  Administered 2015-08-18 – 2015-08-19 (×4): via OPHTHALMIC
  Filled 2015-08-16 (×2): qty 3.5

## 2015-08-16 MED ORDER — SODIUM CHLORIDE 0.9 % IV SOLN: INTRAVENOUS | Status: DC | PRN

## 2015-08-16 MED ORDER — GLYCOPYRROLATE 0.2 MG/ML IJ SOLN
0.1000 mg | INTRAMUSCULAR | Status: DC | PRN
  Filled 2015-08-16: qty 0.5

## 2015-08-16 MED ORDER — ONDANSETRON HCL 4 MG/2ML IJ SOLN: 4.0000 mg | Freq: Four times a day (QID) | INTRAMUSCULAR | Status: DC | PRN

## 2015-08-16 MED ORDER — ONDANSETRON HCL 4 MG PO TABS: 4.0000 mg | ORAL_TABLET | Freq: Four times a day (QID) | ORAL | Status: DC | PRN

## 2015-08-16 NOTE — ED Notes (Signed)
Per [facility]- Pt resides at [facility]. Pt having monthly maintenance of foley change. Not successful with change. Seen at urology for insertion of foley cath. Following procedure, pt had respiratory event with increased RR and AMS with hypotension. However wife states he had been like this since last evening. Pt presents in respiratory distress. EDP present at [facility] upon arrival to ED

## 2015-08-16 NOTE — ED Notes (Addendum)
HOSPICE "ANN" PRESENT SPEAKING WITH WIFE NANCY

## 2015-08-16 NOTE — Progress Notes (Signed)
RT discussed ABG results with MD, RN and wife. Per MD only use BiPAP if in distress. RT assessed PT and appears to be calming down- wife states he is doing much better. We have decided that BiPAP is not warranted at this time. RT encouraged wife and staff to notify RT if changes occur.

## 2015-08-16 NOTE — ED Notes (Signed)
HOSPICE "STACIE SPEAKING WITH FAMILY

## 2015-08-16 NOTE — Progress Notes (Signed)
Palliative consult received. We are currently experiencing high patient referral volumes and do not have a provider available to do a full consult on this patient today. I reviewed the chart and noted that patient and family goals are full comfort- I also see where he is continuing to have severe pain-moaning. Until we can evaluate him I have adjusted the orders to help get him more comfortable.  1. Ok to maintain Morphine infusion but it takes 6-8 hours to reach steady state-noted he is already at  now- bolus dosing should always be ordered. I have added a  bolus order q30 minutes until pain is controlled, avoid any additional drip titration and use bolus dosing instead -monitor for signs of uncomfortable toxicity given rapid titration ncluding twitching, agitation and seizures.  2. Scheduled IV ativan- he was taking po atian at home prior to admission  3. To help expedite discharge I placed a CSW order for hospice facility referral based on Dr. Izola Price notes.  Will see this patient at the earliest time we have a provider available.  Anderson Malta, DO Palliative Medicine [phone]

## 2015-08-16 NOTE — ED Notes (Signed)
EDP NAVANATI PRESENT UPON ARRIVAL TO ED

## 2015-08-16 NOTE — ED Notes (Signed)
Pt can go at 16:05...kjl

## 2015-08-16 NOTE — ED Notes (Signed)
MD at bedside. EDO CAMPOS PRESENT. RRT DEE PRESENT TO OBTAIN ABG

## 2015-08-16 NOTE — H&P (Signed)
[facility] History and Physical  Regie Bunner ZOX:[id number] DOB: 01/29/1933 DOA: 08/21/2015  Referring physician: ED physician, Dr. Rhunette Croft  PCP: Rogelia Boga, MD   Chief Complaint: lethargy   HPI:  Pt is 81 yo male with known end stage Parkinson's disease, left femoral neck fracture in May 2016, now presented to [facility] ED as he was noted to be more lethargic. Please note that pt is unable to provide any history at this time and family at bedside says pt has been getting weaker, bed bound, not eating or drinking much. Worse in the past 24 hours. In ED, pt noted to have HR in 150's, RR up to 27 and oxygen saturation in 80's on RA. Blood work notable for WBC 35 K, CO2 = 16, Cr 1.94. Family at bedside confirmed DNR status, wants to avoid any interventions including blood work or VS checks, allow full comfort.   Assessment and Plan: Active Problems: Septic shock - unclear source, CXR with no signs of PNA - please note that family has asked to keep pt comfortable, PCT consulted - family in agreement with residential hospice  - placed on Morphine drip Acute hypoxic respiratory failure  - keep on oxygen for comfort - avoid other testing, blood work  Acute renal failure - from sepsis - no further blood work to ensure comfort  Severe PCM - allow comfort feeding if pt alert enough   DVT prophylaxis - allowing comfort, no blood work    Radiological Exams on Admission: Dg Abd 1 View  08/17/2015   CLINICAL DATA:  Assess for perforation  EXAM: ABDOMEN - 1 VIEW exam is very limited as the patient was uncooperative and was held by the nurse for this exam.  COMPARISON:  None.  FINDINGS: Very limited examination in rotation. There is no definite air beneath the visualized hemidiaphragms. No bowel obstruction is identified in the visualized bowel loops. Degenerative joint changes of the spine are noted.  IMPRESSION: Very limited examination in rotation. There is no definite air  beneath the visualized hemidiaphragms but the right hemidiaphragm is not completely included.   Electronically Signed   By: Sherian Rein M.D.   On: 08/17/2015 14:30   Dg Chest Portable 1 View  07/27/2015   CLINICAL DATA:  Shortness of breath, and hypotension.  EXAM: PORTABLE CHEST - 1 VIEW  COMPARISON:  None.  FINDINGS: There stable postsurgical changes from median sternotomy and CABG.  Cardiomediastinal silhouette is mildly enlarged. Mediastinal contours appear intact. The aorta is torturous.  There is no evidence of focal airspace consolidation, pleural effusion or pneumothorax. There is mild coarsening of the interstitial markings.  Osseous structures are without acute abnormality. Osteoarthritic changes of bilateral shoulders and thoracic spine are noted. Soft tissues are grossly normal.  IMPRESSION: No evidence of acute airspace consolidation or pulmonary edema.  Mildly enlarged cardiac silhouette.  Mild chronic coarsening of the interstitial markings, likely due to chronic interstitial changes.   Electronically Signed   By: Ted Mcalpine M.D.   On: 08/18/2015 13:23     Code Status: DNR Family Communication: Family at bedside  Disposition Plan: Admit for further evaluation    Danie Binder [facility] [phone]   Review of Systems:  Pt unable to provide due to lethargy     Past Medical History  Diagnosis Date  . CAD (coronary artery disease)     hx CABG in [place] [facility]-1996, stent to native Rt wth DES 2004 hx cardiogenic shock, IABP, myoview 2013 negative ischemia  . Myocardial  infarction 2004  . Osteopenia   . Parkinson disease   . Hyperlipidemia   . Hypertension   . 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 Hypotension 02/2013    lisinopril stopped. + falls  . Dementia     Past Surgical History  Procedure Laterality Date  . Coronary artery bypass graft  1996    X 4  . Total knee arthroplasty      bil  . Carotid stent      x3  . Cardiac catheterization  06/20/03    occl  VG-RCA, occl. VG-LCX, patent VG-DIAG, distal LIMA MOD DISEASE  EF 45%  . Coronary angioplasty with stent placement  07/31/03    DES TO prox, mid and distal RCA  . Total hip arthroplasty Left 05/12/2015    Procedure: LEFT HIP HEMI- ARTHROPLASTY DIRECT ANTERIOR APPROACH;  Surgeon: Tarry Kos, MD;  Location: [facility] OR;  Service: Orthopedics;  Laterality: Left;    Social History:  reports that he has never smoked. He has never used smokeless tobacco. He reports that he drinks alcohol. He reports that he does not use illicit drugs.  No Known Allergies  Family History  Problem Relation Age of Onset  . Heart disease Mother   . Hypertension Mother   . Arthritis Mother   . Heart disease Father   . Kidney disease Father     renal failure  . Heart disease Sister   . Lung disease Brother     Prior to Admission medications   Medication Sig Start Date End Date Taking? Authorizing Provider  acetaminophen (TYLENOL) 325 MG tablet Take 2 tablets (650 mg total) by mouth every 12 (twelve) hours as needed. Patient taking differently: Take 650 mg by mouth every 12 (twelve) hours as needed for mild pain (pain).  12/04/14   Doe-Hyun R Artist Pais, DO  Alum & Mag Hydroxide-Simeth (MAGIC MOUTHWASH) SOLN Take 5 mLs by mouth 3 (three) times daily.    Historical Provider, MD  Amino Acids-Protein Hydrolys (FEEDING SUPPLEMENT, PRO-STAT SUGAR FREE 64,) LIQD Take 30 mLs by mouth 3 (three) times daily with meals.    Historical Provider, MD  Ascorbic Acid (VITAMIN C) 500 MG tablet Take 500 mg by mouth daily.      Historical Provider, MD  aspirin EC 325 MG tablet Take 1 tablet (325 mg total) by mouth 2 (two) times daily. 05/12/15   Tarry Kos, MD  atorvastatin (LIPITOR) 20 MG tablet Take 20 mg by mouth daily at 6 PM.  04/05/15   Historical Provider, MD  carbidopa-levodopa (SINEMET CR) 50-200 MG per tablet Take 1 tablet by mouth 4 (four) times daily. Take 1 Tablet at 8 am, Take 1 Tablet at 12 pm , Take 1 Tablet at 4 pm, Take 1 Tablet  at 8 pm.    Historical Provider, MD  HYDROcodone-acetaminophen (NORCO) 7.5-325 MG per tablet Take 1-2 tablets by mouth every 6 (six) hours as needed for moderate pain. 05/12/15   Naiping Donnelly Stager, MD  rivastigmine (EXELON) 3 MG capsule Take 3 mg by mouth 2 (two) times daily.    Historical Provider, MD    Physical Exam: Filed Vitals:   08/22/2015 1242 08/17/2015 1247 08/15/2015 1417  BP:  166/144 95/62  Pulse:  150 140  Temp:  98.2 F (36.8 C)   TempSrc:  Rectal   Resp:  27 17  SpO2: 86% 80% 94%    Physical Exam  Constitutional: Appears in mild distress, lethargic  HENT: Dry MM  Eyes: PERRLA, no scleral icterus.  Neck: No JVD. No tracheal deviation. No thyromegaly.  CVS: Regular rhythm, tachycardic, no gallops, no carotid bruit.  Pulmonary: Tachypnea with increased work of breathing, poor air movement bilaterally  Abdominal: Soft. BS +,  no distension, Musculoskeletal: No tenderness to palpation  Lymphadenopathy: No lymphadenopathy noted, cervical, inguinal. Neuro: lethargic Skin: No erythema. No pallor.  Psychiatric: unable to assess due to lethargy   Labs on Admission:  Basic Metabolic Panel:  Recent Labs Lab 08-Sep-2015 1236  NA 144  K 4.5  CL 107  CO2 16*  GLUCOSE 103*  BUN 45*  CREATININE 1.94*  CALCIUM 9.3   Liver Function Tests:  Recent Labs Lab 2015/09/08 1236  AST 39  ALT 5*  ALKPHOS 97  BILITOT 0.6  PROT 7.1  ALBUMIN 3.6   No results for input(s): LIPASE, AMYLASE in the last 168 hours. No results for input(s): AMMONIA in the last 168 hours. CBC:  Recent Labs Lab 09/08/2015 1236  WBC 35.6*  NEUTROABS 32.7*  HGB 10.3*  HCT 33.3*  MCV 99.4  PLT 331   Cardiac Enzymes:  Recent Labs Lab 2015-09-08 1236  TROPONINI 0.06*   BNP: Invalid input(s): POCBNP CBG: No results for input(s): GLUCAP in the last 168 hours.  EKG: Normal sinus rhythm, no ST/T wave changes   If 7PM-7AM, please contact night-coverage [email or web address] Password [facility] 08-Sep-2015, 3:39  PM

## 2015-08-16 NOTE — Progress Notes (Signed)
Per PT RN- no Arterial line needed at this time.

## 2015-08-16 NOTE — Progress Notes (Signed)
Dr Phillips Odor placed new orders concerning Morphine boluses and discontinued care order with titration limits.  I called and spoke with pallaitive concerning Morphine drip rate  And was instructed to put Morphine drip at /hr and use boluses per order and ativan to keep patient comfortable.

## 2015-08-16 NOTE — ED Notes (Signed)
16:35 pt can go to floor.Marland Kitchenkjl

## 2015-08-16 NOTE — Progress Notes (Signed)
Patient continues to moan with pain . Morphine adjusted to  /hr

## 2015-08-16 NOTE — ED Notes (Signed)
Nurse drawing labs. 

## 2015-08-16 NOTE — ED Notes (Signed)
Bed: WA14 Expected date:  Expected time:  Means of arrival:  Comments: Hold for triage 1 

## 2015-08-16 NOTE — ED Notes (Signed)
MD at bedside. PT IS NOW COMFORT CARE

## 2015-08-16 NOTE — ED Notes (Signed)
Family does not want blood work done.

## 2015-08-17 DIAGNOSIS — Z7189 Other specified counseling: Secondary | ICD-10-CM

## 2015-08-17 NOTE — Progress Notes (Signed)
CSW consulted to assist with residential hospice home placement. CSW reviewed PN and spoke with MD. Pt's prognosis is poor. Hospital death is expected.   Cori Razor LCSW [phone]

## 2015-08-17 NOTE — ED Provider Notes (Signed)
CSN: [id number]     Arrival date & time 08/03/2015  1218 History   First MD Initiated Contact with Patient 08/20/2015 1223     Chief Complaint  Patient presents with  . Shortness of Breath  . Hypotension  . Respiratory Distress     (Consider location/radiation/quality/duration/timing/severity/associated sxs/prior Treatment) HPI Comments: Pt comes in with cc of shortness of breath and respiratory distress. Level 5 caveat for advanced dementia.  Pt resides at a nursing facility, and is a hospice patient. Code status is DNR, wife also states that patient's wish would be DNR, DNI. Pt had a urologic procedure (foley placement) placement earlier - and started having respiratory distress and tachycardia - thus brought to the ER. Pt is noted to have low BP here as well.   Patient is a 81 y.o. male presenting with shortness of breath. The history is provided by the patient.  Shortness of Breath   Past Medical History  Diagnosis Date  . CAD (coronary artery disease)     hx CABG in [place] [facility]-1996, stent to native Rt wth DES 2004 hx cardiogenic shock, IABP, myoview 2013 negative ischemia  . Myocardial infarction 2004  . Osteopenia   . Parkinson disease   . Hyperlipidemia   . Hypertension   . 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 Hypotension 02/2013    lisinopril stopped. + falls  . Dementia    Past Surgical History  Procedure Laterality Date  . Coronary artery bypass graft  1996    X 4  . Total knee arthroplasty      bil  . Carotid stent      x3  . Cardiac catheterization  06/20/03    occl VG-RCA, occl. VG-LCX, patent VG-DIAG, distal LIMA MOD DISEASE  EF 45%  . Coronary angioplasty with stent placement  07/31/03    DES TO prox, mid and distal RCA  . Total hip arthroplasty Left 05/12/2015    Procedure: LEFT HIP HEMI- ARTHROPLASTY DIRECT ANTERIOR APPROACH;  Surgeon: Tarry Kos, MD;  Location: [facility] OR;  Service: Orthopedics;  Laterality: Left;   Family History  Problem Relation Age of  Onset  . Heart disease Mother   . Hypertension Mother   . Arthritis Mother   . Heart disease Father   . Kidney disease Father     renal failure  . Heart disease Sister   . Lung disease Brother    Social History  Substance Use Topics  . Smoking status: Never Smoker   . Smokeless tobacco: Never Used  . Alcohol Use: Yes    Review of Systems  Unable to perform ROS: Dementia  Respiratory: Positive for shortness of breath.       Allergies  Review of patient's allergies indicates no known allergies.  Home Medications   Prior to Admission medications   Medication Sig Start Date End Date Taking? Authorizing Provider  acetaminophen (TYLENOL) 325 MG tablet Take 2 tablets (650 mg total) by mouth every 12 (twelve) hours as needed. Patient taking differently: Take 650 mg by mouth every 12 (twelve) hours as needed for mild pain (pain).  12/04/14  Yes Doe-Hyun R Artist Pais, DO  Amino Acids-Protein Hydrolys (FEEDING SUPPLEMENT, PRO-STAT SUGAR FREE 64,) LIQD Take 30 mLs by mouth 3 (three) times daily with meals.   Yes Historical Provider, MD  Ascorbic Acid (VITAMIN C) 500 MG tablet Take 500 mg by mouth daily.     Yes Historical Provider, MD  aspirin EC 325 MG tablet Take 1  tablet (325 mg total) by mouth 2 (two) times daily. 05/12/15  Yes Naiping Donnelly Stager, MD  carbidopa-levodopa (SINEMET CR) 50-200 MG per tablet Take 1 tablet by mouth 4 (four) times daily. Take 1 Tablet at 8 am, Take 1 Tablet at 12 pm , Take 1 Tablet at 4 pm, Take 1 Tablet at 8 pm.   Yes Historical Provider, MD  donepezil (ARICEPT) 10 MG tablet Take 10 mg by mouth at bedtime.   Yes Historical Provider, MD  HYDROcodone-acetaminophen (NORCO) 7.5-325 MG per tablet Take 1-2 tablets by mouth every 6 (six) hours as needed for moderate pain.   Yes Historical Provider, MD  LORazepam (ATIVAN) 0.5 MG tablet Take 0.5 mg by mouth every 8 (eight) hours as needed for anxiety.   Yes Historical Provider, MD   BP 78/53 mmHg  Pulse 103  Temp(Src) 99.3 F  (37.4 C) (Axillary)  Resp 12  SpO2 100% Physical Exam  Constitutional: He appears distressed.  HENT:  Head: Atraumatic.  Eyes: Conjunctivae are normal.  Neck: Neck supple.  Pulmonary/Chest: He is in respiratory distress.  Abdominal: There is tenderness.  Lower quadrant tenderness  Skin: Skin is warm.  Nursing note and vitals reviewed.   ED Course  Procedures (including critical care time) Labs Review Labs Reviewed  CBC WITH DIFFERENTIAL/PLATELET - Abnormal; Notable for the following:    WBC 35.6 (*)    RBC 3.35 (*)    Hemoglobin 10.3 (*)    HCT 33.3 (*)    Neutrophils Relative % 92 (*)    Lymphocytes Relative 3 (*)    Neutro Abs 32.7 (*)    Monocytes Absolute 1.8 (*)    All other components within normal limits  COMPREHENSIVE METABOLIC PANEL - Abnormal; Notable for the following:    CO2 16 (*)    Glucose, Bld 103 (*)    BUN 45 (*)    Creatinine, Ser 1.94 (*)    ALT 5 (*)    GFR calc non Af Amer 31 (*)    GFR calc Af Amer 36 (*)    Anion gap 21 (*)    All other components within normal limits  LACTIC ACID, PLASMA - Abnormal; Notable for the following:    Lactic Acid, Venous 10.7 (*)    All other components within normal limits  TROPONIN I - Abnormal; Notable for the following:    Troponin I 0.06 (*)    All other components within normal limits  BLOOD GAS, ARTERIAL - Abnormal; Notable for the following:    pCO2 arterial 21.7 (*)    pO2, Arterial 108 (*)    Bicarbonate 12.5 (*)    Acid-base deficit 10.9 (*)    All other components within normal limits  CULTURE, BLOOD (ROUTINE X 2)  CULTURE, BLOOD (ROUTINE X 2)  APTT    Imaging Review Dg Abd 1 View  07/31/2015   CLINICAL DATA:  Assess for perforation  EXAM: ABDOMEN - 1 VIEW exam is very limited as the patient was uncooperative and was held by the nurse for this exam.  COMPARISON:  None.  FINDINGS: Very limited examination in rotation. There is no definite air beneath the visualized hemidiaphragms. No bowel  obstruction is identified in the visualized bowel loops. Degenerative joint changes of the spine are noted.  IMPRESSION: Very limited examination in rotation. There is no definite air beneath the visualized hemidiaphragms but the right hemidiaphragm is not completely included.   Electronically Signed   By: Gabriel Carina.D.  On: 2015-08-28 14:30   Dg Chest Portable 1 View  Aug 28, 2015   CLINICAL DATA:  Shortness of breath, and hypotension.  EXAM: PORTABLE CHEST - 1 VIEW  COMPARISON:  None.  FINDINGS: There stable postsurgical changes from median sternotomy and CABG.  Cardiomediastinal silhouette is mildly enlarged. Mediastinal contours appear intact. The aorta is torturous.  There is no evidence of focal airspace consolidation, pleural effusion or pneumothorax. There is mild coarsening of the interstitial markings.  Osseous structures are without acute abnormality. Osteoarthritic changes of bilateral shoulders and thoracic spine are noted. Soft tissues are grossly normal.  IMPRESSION: No evidence of acute airspace consolidation or pulmonary edema.  Mildly enlarged cardiac silhouette.  Mild chronic coarsening of the interstitial markings, likely due to chronic interstitial changes.   Electronically Signed   By: Ted Mcalpine M.D.   On: 2015/08/28 13:23   I have personally reviewed and evaluated these images and lab results as part of my medical decision-making.   EKG Interpretation   Date/Time:  Thursday August 28, 2015 12:51:31 EDT Ventricular Rate:  126 PR Interval:  212 QRS Duration: 103 QT Interval:  366 QTC Calculation: 530 R Axis:   100 Text Interpretation:  Sinus tachycardia Multiple premature complexes, vent   Prolonged PR interval Consider left atrial enlargement Probable lateral  infarct, old Prolonged QT interval Artifact in lead(s) I II III aVR aVF V1  V2 V4 V5 V6 and baseline wander in lead(s) I III No significant change was  found Interpretation limited secondary to artifact  Confirmed by CAMPOS   MD, Caryn Bee ([id number]) on August 28, 2015 1:06:05 PM      MDM   Final diagnoses:  Shock  Lactic acidosis    Pt arrives in extremis.  Code status is determined to be DNR/DNI. He has advanced dementia, CAD hx. Pt has tachycardia, tachypnea, and likely low BP. He is having chills.rigors. Initially, wife wanted pt to get antibiotics, fluids, pressors - we were talking about placing a groin central and arterial line - and after thinking about the procedure and discussing care with daughter - wife determined that she no longer wanted to escalate care. We had spoken with Hospice team, and they promptly sent RN, who sat down with family, and code status was converted to comfort care measures only.  CRITICAL CARE Performed by: Derwood Kaplan   Total critical care time: 55 min  Critical care time was exclusive of separately billable procedures and treating other patients.  Critical care was necessary to treat or prevent imminent or life-threatening deterioration.  Critical care was time spent personally by me on the following activities: development of treatment plan with patient and/or surrogate as well as nursing, discussions with consultants, evaluation of patient's response to treatment, examination of patient, obtaining history from patient or surrogate, ordering and performing treatments and interventions, ordering and review of laboratory studies, ordering and review of radiographic studies, pulse oximetry and re-evaluation of patient's condition.   Derwood Kaplan, MD 08/17/15 [phone]

## 2015-08-17 NOTE — Progress Notes (Signed)
Patient being transferred to [facility], report called to [place], Charity fundraiser. Transferred and patient was set up in room with family, Lupita Leash, RN then took over patients care.

## 2015-08-17 NOTE — Progress Notes (Signed)
[facility]RN Visit-Stacie Meredith Mody RN, BSN  Patient admitted to [facility] for EOL care, after being transported by EMS from a urology appointment.  This is a related admission to [facility] diagnosis of Parkinson's Disease.  Patient is a DNR. Patient seen at beside, unresponsive with agonal breaths noted.  Patient appears comfortable and Morphine drip infusing at /hr via a PIV.  Wife and daughter at bedside and agree patient is too unstable to transfer and wish for end of life care at the hospital.  Family denies any needs.  [facility] will continue to follow.  Chelsea Aus [facility] Liaison [phone]

## 2015-08-17 NOTE — Progress Notes (Signed)
PROGRESS NOTE  Keith Francis WUJ:[id number] DOB: 1933/01/09 DOA: August 20, 2015 PCP: Rogelia Boga, MD   HPI: 81 yo M with end stage Parkinson's disease, left femoral neck fracture in May 2016, admitted 8/25 in septic shock. Per admission MD, family wished for full comfort without any interventions such as antibiotics, blood work.   Subjective / 24 H Interval events - unresponsive this morning, with agonal breaths, appears to be actively dying  Assessment/Plan: Active Problems:   Lactic acidosis   Septic shock  Goals of care - I have confirmed with family today comfort measures as main goals of therapy - I don't think he is stable for transfer as he appears to have agonal breathing now, discussed with SW to hold residential hospice Septic shock  - positive blood cultures with GNR Acute hypoxic respiratory failure  - keep on oxygen for comfort - avoid other testing, blood work  Acute renal failure - from sepsis - no further blood work to ensure comfort  Severe PCM - allow comfort feeding if pt alert enough   Diet: Diet regular Room service appropriate?: Yes; Fluid consistency:: Thin Fluids: none DVT Prophylaxis: none  Code Status: DNR Family Communication: discussed with wife and daughter bedside  Disposition Plan: anticipate in hospital death  Consultants:  Palliative   Procedures:  None    Antibiotics Vancomycin / Zosyn x 1 in the ED   Studies  Dg Abd 1 View  2015-08-20   CLINICAL DATA:  Assess for perforation  EXAM: ABDOMEN - 1 VIEW exam is very limited as the patient was uncooperative and was held by the nurse for this exam.  COMPARISON:  None.  FINDINGS: Very limited examination in rotation. There is no definite air beneath the visualized hemidiaphragms. No bowel obstruction is identified in the visualized bowel loops. Degenerative joint changes of the spine are noted.  IMPRESSION: Very limited examination in rotation. There is no definite air  beneath the visualized hemidiaphragms but the right hemidiaphragm is not completely included.   Electronically Signed   By: Sherian Rein M.D.   On: 2015/08/20 14:30   Dg Chest Portable 1 View  08-20-2015   CLINICAL DATA:  Shortness of breath, and hypotension.  EXAM: PORTABLE CHEST - 1 VIEW  COMPARISON:  None.  FINDINGS: There stable postsurgical changes from median sternotomy and CABG.  Cardiomediastinal silhouette is mildly enlarged. Mediastinal contours appear intact. The aorta is torturous.  There is no evidence of focal airspace consolidation, pleural effusion or pneumothorax. There is mild coarsening of the interstitial markings.  Osseous structures are without acute abnormality. Osteoarthritic changes of bilateral shoulders and thoracic spine are noted. Soft tissues are grossly normal.  IMPRESSION: No evidence of acute airspace consolidation or pulmonary edema.  Mildly enlarged cardiac silhouette.  Mild chronic coarsening of the interstitial markings, likely due to chronic interstitial changes.   Electronically Signed   By: Ted Mcalpine M.D.   On: 08/20/2015 13:23    Objective  Filed Vitals:   Aug 20, 2015 1651 2015-08-20 2122 08/17/15 0224 08/17/15 0606  BP: 109/63 89/53 81/52  78/53  Pulse: 102 102 102 103  Temp: 97.6 F (36.4 C) 98.3 F (36.8 C) 100.9 F (38.3 C) 99.3 F (37.4 C)  TempSrc: Axillary Axillary Axillary Axillary  Resp: SpO2: 100% 100% 100% 100%    Intake/Output Summary (Last 24 hours) at 08/17/15 1115 Last data filed at 08/17/15 0607  Gross per 24 hour  Intake   1700 ml  Output  175 ml  Net   1525 ml   There were no vitals filed for this visit.  Exam: Deferred for comfort  Data Reviewed: Basic Metabolic Panel:  Recent Labs Lab 09/05/15 1236  NA 144  K 4.5  CL 107  CO2 16*  GLUCOSE 103*  BUN 45*  CREATININE 1.94*  CALCIUM 9.3   Liver Function Tests:  Recent Labs Lab September 05, 2015 1236  AST 39  ALT 5*  ALKPHOS 97  BILITOT 0.6    PROT 7.1  ALBUMIN 3.6   CBC:  Recent Labs Lab 09/05/15 1236  WBC 35.6*  NEUTROABS 32.7*  HGB 10.3*  HCT 33.3*  MCV 99.4  PLT 331   Cardiac Enzymes:  Recent Labs Lab 09-05-2015 1236  TROPONINI 0.06*     Recent Results (from the past 240 hour(s))  Blood culture (routine x 2)     Status: None (Preliminary result)   Collection Time: 05-Sep-2015  1:09 PM  Result Value Ref Range Status   Specimen Description BLOOD RIGHT ANTECUBITAL  Final   Special Requests BOTTLES DRAWN AEROBIC AND ANAEROBIC 5CC  Final   Culture  Setup Time   Final    GRAM NEGATIVE RODS IN BOTH AEROBIC AND ANAEROBIC BOTTLES CRITICAL RESULT CALLED TO, READ BACK BY AND VERIFIED WITH: R RIMANDO@0436  08/17/15 MKELLY Performed at [facility]    Culture PENDING  Incomplete   Report Status PENDING  Incomplete  Blood culture (routine x 2)     Status: None (Preliminary result)   Collection Time: 09-05-15  1:25 PM  Result Value Ref Range Status   Specimen Description BLOOD LEFT ANTECUBITAL  Final   Special Requests BOTTLES DRAWN AEROBIC AND ANAEROBIC 5CC EACH  Final   Culture  Setup Time   Final    GRAM NEGATIVE RODS AEROBIC BOTTLE ONLY CRITICAL RESULT CALLED TO, READ BACK BY AND VERIFIED WITH: R RIMANDO @0436  08/17/15 MKELLY GRAM NEGATIVE RODS AND GRAM POSITIVE COCCI IN CHAINS IN ANAEROBE BOTTLE    Culture PENDING  Incomplete   Report Status PENDING  Incomplete     Scheduled Meds: . LORazepam  1 mg Intravenous TID   Continuous Infusions: . morphine 5 mg/hr (2015/09/05 1916)   Time spent: 15 minutes discussing with family bedside, coordinating care with SW and RN  Pamella Pert, MD [facility] Pager [phone]. If 7 PM - 7 AM, please contact night-coverage at [email or web address], password [facility] 08/17/2015, 11:15 AM  LOS: 1 day

## 2015-08-17 NOTE — Progress Notes (Signed)
Patient is approaching EOL. Hospital death anticipated.No symptom management needs. Palliative team will sign off. Hospice team supporting. Payor should be changed to Hospice and Inpatient status be updated. Please call if we can be of further assistance.  Anderson Malta, DO Palliative Medicine [phone]

## 2015-08-17 NOTE — Progress Notes (Signed)
Laboratory called, patients blood culture  Positive of Gram (-) Rods. Notified on call provider

## 2015-08-18 DIAGNOSIS — Z515 Encounter for palliative care: Secondary | ICD-10-CM

## 2015-08-18 MED ORDER — ACETAMINOPHEN 10 MG/ML IV SOLN
650.0000 mg | Freq: Four times a day (QID) | INTRAVENOUS | Status: AC | PRN
  Administered 2015-08-18 – 2015-08-19 (×2): 650 mg via INTRAVENOUS
  Filled 2015-08-18 (×4): qty 65

## 2015-08-18 MED ORDER — ACETAMINOPHEN 10 MG/ML IV SOLN: 650.0000 mg | Freq: Four times a day (QID) | INTRAVENOUS | Status: DC | PRN

## 2015-08-18 NOTE — Progress Notes (Signed)
Mr. Comes daughters Earlene Plater and Dewayne Hatch) were bedside when I arrived. Dewayne Hatch was holding his hand. Both were coping well and concerned about their parents saying they were pleased their mother had gone home to refresh and rest. Parents have been married 55 years. Please page if additional support is needed. [phone] Chaplain Elmarie Shiley Holder   08/18/15 1900  Clinical Encounter Type  Visited With Family

## 2015-08-18 NOTE — Progress Notes (Signed)
[facility] Work Visit - Erling Conte, LCSW  Patient admitted to [facility] by EMS from urology appointment. This is related admission Dx Parkinson's Disease. Patient is DNR.  Chart reviewed, received report from RN Josph Macho and met with spouse Izora Gala and daughter Juleen China at bedside. Family very attentive to patient, playing his favorite music at bedside. Patient remains unresponsive and appears comfortable with Morphine drip infusing at 73m/hr via PIV. Patient on non-rebreather at time of this visit. Spouse and daughter met with MD earlier this morning and report continued plan for anticipated hospital death. They express strong faith and have sense of peace that "this is in God's hands." They are very pleased with [facility] staff and care patient is receiving. They expressed confidence in their bedside RN FJosph Macho They report no other needs at this time other than their desire to be present. They are aware [facility] RN SLanetta Inchwill see patient tomorrow.   Please do not hesitate to contact [facility] at [phone] hospice needs or at time of death.  Thank you. EErling Conte [phone]

## 2015-08-18 NOTE — Progress Notes (Signed)
Patient seen this morning, discussed with family. Continues to be unresponsive with agonal breathing. Fever curve has increased and appears to have intermittent rigors. Continue comfort measures, anticipate in-hospital death.   Costin M. Elvera Lennox, MD [facility] [phone]

## 2015-08-19 DIAGNOSIS — J9601 Acute respiratory failure with hypoxia: Secondary | ICD-10-CM

## 2015-08-19 DIAGNOSIS — A419 Sepsis, unspecified organism: Principal | ICD-10-CM

## 2015-08-19 DIAGNOSIS — R6521 Severe sepsis with septic shock: Secondary | ICD-10-CM

## 2015-08-19 DIAGNOSIS — N179 Acute kidney failure, unspecified: Secondary | ICD-10-CM

## 2015-08-19 MED ORDER — ACETAMINOPHEN 10 MG/ML IV SOLN
500.0000 mg | Freq: Four times a day (QID) | INTRAVENOUS | Status: DC | PRN
  Administered 2015-08-19: 500 mg via INTRAVENOUS
  Filled 2015-08-19 (×2): qty 50

## 2015-08-20 DIAGNOSIS — Z515 Encounter for palliative care: Secondary | ICD-10-CM

## 2015-08-20 DIAGNOSIS — N179 Acute kidney failure, unspecified: Secondary | ICD-10-CM | POA: Diagnosis present

## 2015-08-20 DIAGNOSIS — J9601 Acute respiratory failure with hypoxia: Secondary | ICD-10-CM | POA: Diagnosis present

## 2015-08-20 LAB — CULTURE, BLOOD (ROUTINE X 2)

## 2015-08-23 NOTE — Progress Notes (Signed)
PROGRESS NOTE  Keith Francis FAO:[id number] DOB: [birth year] DOA: 07/29/2015 PCP: Rogelia Boga, MD   HPI: 81 yo M with end stage Parkinson's disease, left femoral neck fracture in May 2016, admitted 8/25 in septic shock. Per admission MD, family wished for full comfort without any interventions such as antibiotics, blood work.   Subjective / 24 H Interval events - unresponsive, breathing 4-5 times per minute  Assessment/Plan: Active Problems:   Lactic acidosis   Septic shock  Goals of care - appears to be actively dying Septic shock  - positive blood cultures with E coli - febrile, prn tylenol  Acute hypoxic respiratory failure  - keep on oxygen for comfort - avoid other testing, blood work  Acute renal failure - from sepsis - no further blood work to ensure comfort  Severe PCM  Diet: Diet regular Room service appropriate?: Yes; Fluid consistency:: Thin Fluids: none DVT Prophylaxis: none  Code Status: DNR Family Communication: discussed with wife and daughters bedside  Disposition Plan: anticipate in hospital death  Consultants:  Palliative   Procedures:  None    Antibiotics Vancomycin / Zosyn x 1 in the ED   Studies  No results found.  Objective  Filed Vitals:   08/17/15 0606 08/17/15 1200 08/18/15 0600 2015/09/15 0536  BP: 78/53 99/53 118/63 171/110  Pulse: 103 110 113 72  Temp: 99.3 F (37.4 C)  102.2 F (39 C) 103.6 F (39.8 C)  TempSrc: Axillary  Axillary Axillary  Resp: Height:    (1.778 m)   Weight:   44.1 kg (97 lb 3.6 oz)   SpO2: 100% 100% 100% 100%    Intake/Output Summary (Last 24 hours) at 09-15-15 1234 Last data filed at 15-Sep-2015 0537  Gross per 24 hour  Intake    105 ml  Output   1150 ml  Net  -1045 ml   Filed Weights   08/18/15 0600  Weight: 44.1 kg (97 lb 3.6 oz)    Exam: Deferred for comfort  Data Reviewed: Basic Metabolic Panel:  Recent Labs Lab 08/18/2015 1236  NA 144  K 4.5    CL 107  CO2 16*  GLUCOSE 103*  BUN 45*  CREATININE 1.94*  CALCIUM 9.3   Liver Function Tests:  Recent Labs Lab 08/18/2015 1236  AST 39  ALT 5*  ALKPHOS 97  BILITOT 0.6  PROT 7.1  ALBUMIN 3.6   CBC:  Recent Labs Lab 08/11/2015 1236  WBC 35.6*  NEUTROABS 32.7*  HGB 10.3*  HCT 33.3*  MCV 99.4  PLT 331   Cardiac Enzymes:  Recent Labs Lab 07/26/2015 1236  TROPONINI 0.06*     Recent Results (from the past 240 hour(s))  Blood culture (routine x 2)     Status: None (Preliminary result)   Collection Time: 08/15/2015  1:09 PM  Result Value Ref Range Status   Specimen Description BLOOD RIGHT ANTECUBITAL  Final   Special Requests BOTTLES DRAWN AEROBIC AND ANAEROBIC 5CC  Final   Culture  Setup Time   Final    GRAM NEGATIVE RODS IN BOTH AEROBIC AND ANAEROBIC BOTTLES CRITICAL RESULT CALLED TO, READ BACK BY AND VERIFIED WITH: R RIMANDO@0436  08/17/15 MKELLY GRAM POSITIVE COCCI IN CHAINS IN BOTH AEROBIC AND ANAEROBIC BOTTLES    Culture   Final    ESCHERICHIA COLI ENTEROCOCCUS SPECIES Performed at [facility]    Report Status PENDING  Incomplete  Blood culture (routine x 2)     Status: None (Preliminary  result)   Collection Time: September 05, 2015  1:25 PM  Result Value Ref Range Status   Specimen Description BLOOD LEFT ANTECUBITAL  Final   Special Requests BOTTLES DRAWN AEROBIC AND ANAEROBIC 5CC EACH  Final   Culture  Setup Time   Final    GRAM NEGATIVE RODS AEROBIC BOTTLE ONLY CRITICAL RESULT CALLED TO, READ BACK BY AND VERIFIED WITH: R RIMANDO @0436  08/17/15 MKELLY GRAM NEGATIVE RODS AND GRAM POSITIVE COCCI IN CHAINS IN ANAEROBE BOTTLE    Culture   Final    ESCHERICHIA COLI ENTEROCOCCUS SPECIES SUSCEPTIBILITIES PERFORMED ON PREVIOUS CULTURE WITHIN THE LAST 5 DAYS. Performed at [facility]    Report Status PENDING  Incomplete     Scheduled Meds: . LORazepam  1 mg Intravenous TID   Continuous Infusions: . morphine 5 mg/hr (08/18/15 1449)   Time  spent: 15 minutes discussing with family bedside  Pamella Pert, MD [facility] Pager [phone]. If 7 PM - 7 AM, please contact night-coverage at [email or web address], password [facility] 08/01/2015, 12:34 PM  LOS: 3 days

## 2015-08-23 NOTE — Progress Notes (Addendum)
Informed by family at approximately 2204/04/28 that patient was not breathing. On assessment no heart rate or breath sounds noted. Pupils fixed.This was verified with Mick Sell RN. Patient was pronounced dead at 2207/04/29. Wasted 130cc of Morphine 250 mg in 250 cc with Mick Sell RN. Body left the unit at approximately 2349.  I witnessed as stated above. Mick Sell RN

## 2015-08-23 NOTE — Progress Notes (Signed)
[facility] [place]RN Visit-Stacie Meredith Mody RN, BSN  This is a related admission to [facility] diagnosis of Parkinson's Disease. Patient is a DNR. Patient seen at beside, unresponsive with agonal breaths noted. Patient appears comfortable and Morphine drip infusing at /hr via a PIV. Wife and daughters at bedside. Family verbalized gratefulness for the care the patient is receiving at the hospital. Family denies any needs at this time.  [facility] will continue to follow.  Please call with any questions or concerns.  Thank you, Chelsea Aus [facility] Liaison [phone]

## 2015-08-23 NOTE — Discharge Summary (Signed)
  Physician Death Summary  Keith Francis JYN:[id number] DOB: Sep 26, 1933 DOA: September 08, 2015  PCP: Rogelia Boga, MD  Admit date: 09-08-2015 Death date: 12-Sep-2015  Diagnoses:  Principal Problem:   Septic shock Active Problems:   PARKINSON'S DISEASE   Altered mental state   Protein-calorie malnutrition, severe   Leukocytosis   Lactic acidosis   End of life care   AKI (acute kidney injury)   Acute respiratory failure with hypoxia  History of present illness:  Per Dr. Izola Price admitting MD,  Pt is 81 yo male with known end stage Parkinson's disease, left femoral neck fracture in May 2016, now presented to [facility] ED as he was noted to be more lethargic. Please note that pt is unable to provide any history at this time and family at bedside says pt has been getting weaker, bed bound, not eating or drinking much. Worse in the past 24 hours. In ED, pt noted to have HR in 150's, RR up to 27 and oxygen saturation in 80's on RA. Blood work notable for WBC 35 K, CO2 = 16, Cr 1.94. Family at bedside confirmed DNR status, wants to avoid any interventions including blood work or VS checks, allow full comfort.   Hospital Course:   Goals of care - patient was admitted to the hospital with septic shock, per initial discussions on admission, family wished for full comfort and no other interventions. Patient has progressively declined and has passed on 09-11-2023. Septic shock - positive blood cultures with E coli and Enterococcus Acute hypoxic respiratory failure - keep on oxygen for comfort Acute renal failure - from sepsis Severe PCM   Signed:  Emiliana Blaize  [facility] 09-12-15, 1:17 PM

## 2015-08-23 DEATH — deceased

## 2015-11-02 IMAGING — CR DG CHEST 2V
2 series · 2 of 2 positions shown · non-contrast
Comparison: 01/10/2014

CLINICAL DATA: Productive cough, diaphoresis

EXAM:
CHEST  2 VIEW

[w chest lat]
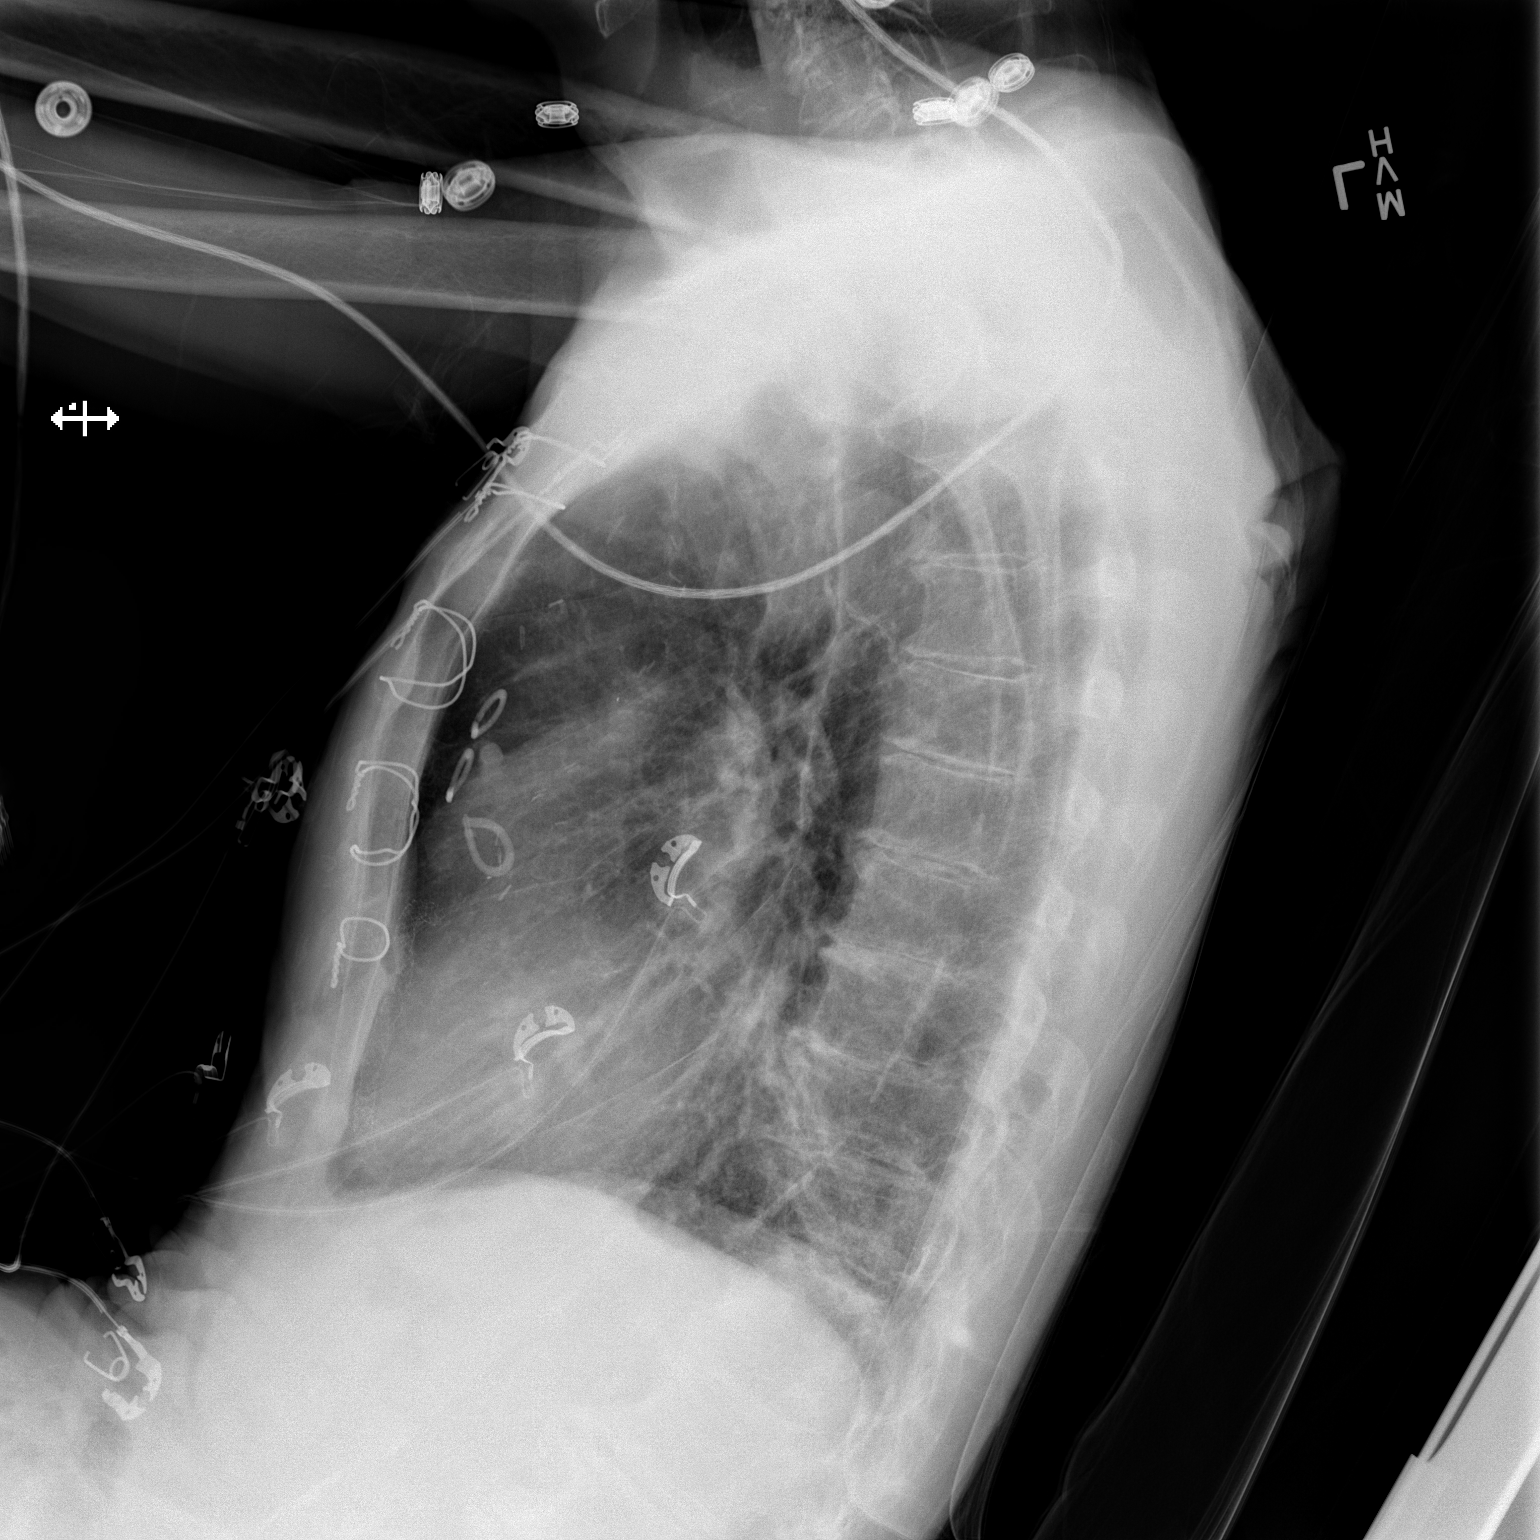

[x chest ap]
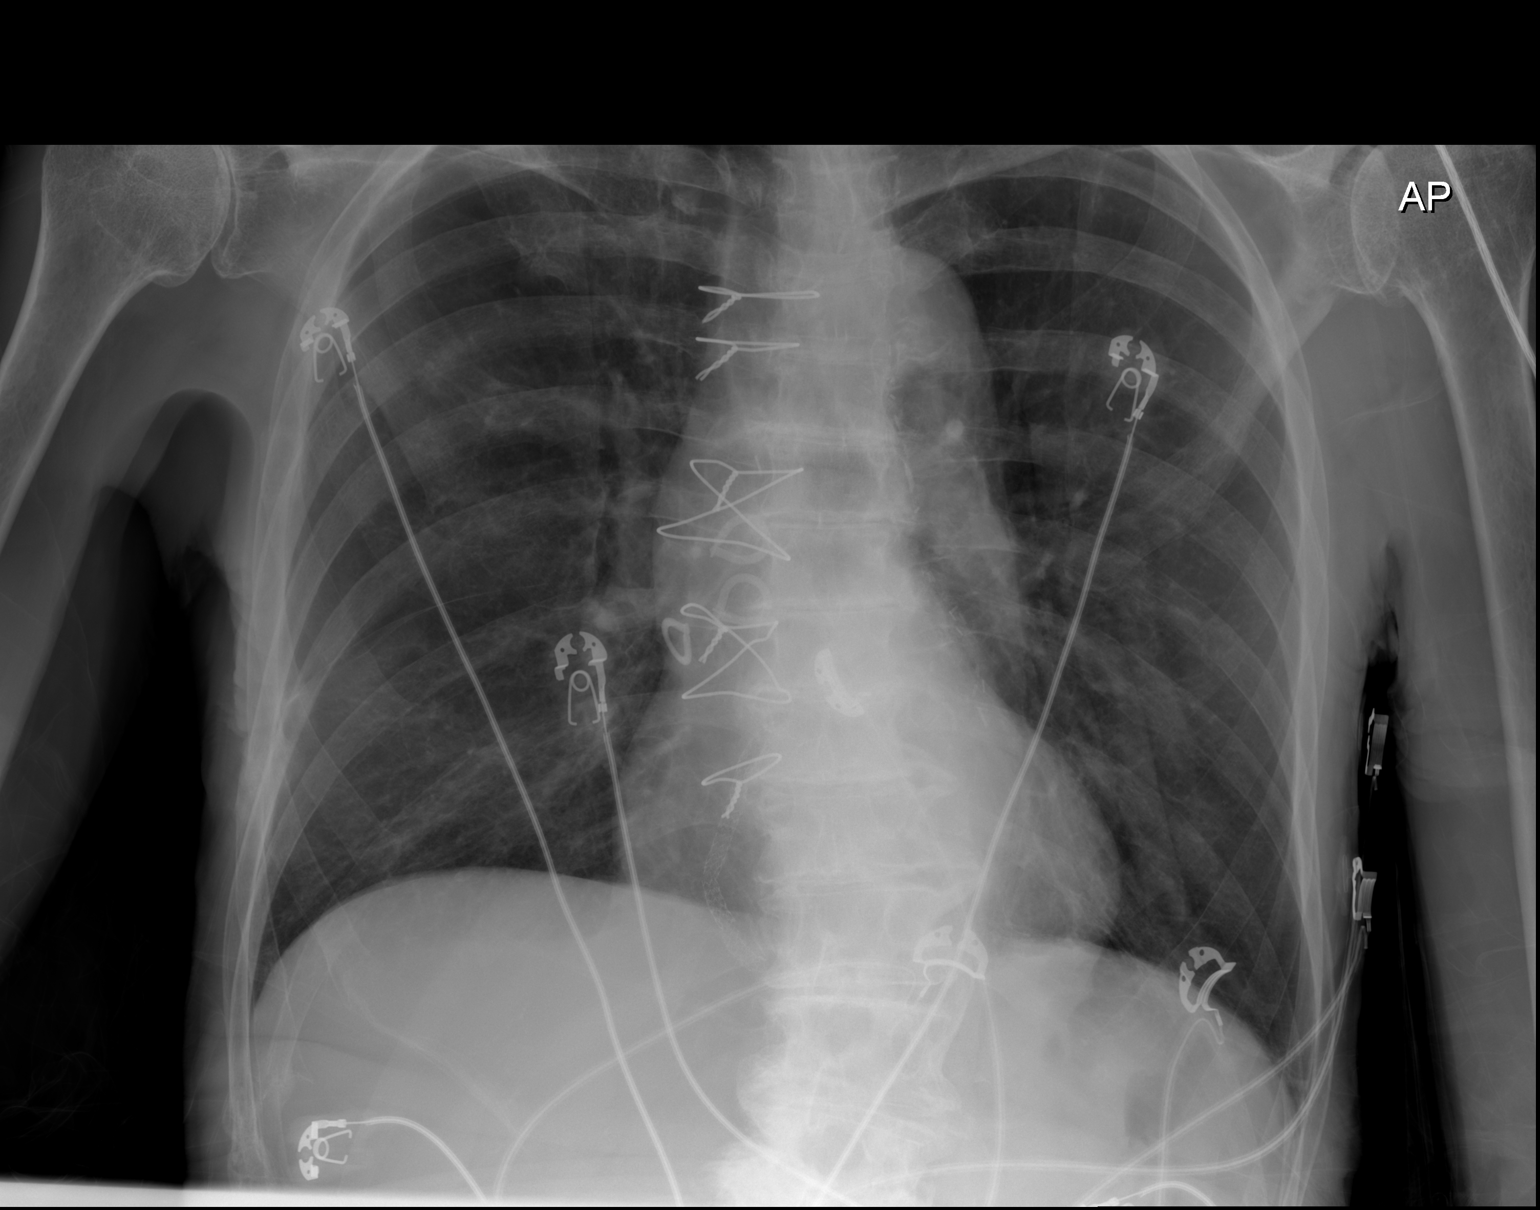

[2 of 2 positions shown; findings below may reference images not displayed]

FINDINGS: Cardiomediastinal silhouette is stable. Status post CABG. No acute
infiltrate or pleural effusion. No pulmonary edema. Degenerative
changes bilateral shoulders. Stable degenerative changes thoracic
spine.
IMPRESSION: No active cardiopulmonary disease.  No significant change.

## 2015-11-06 IMAGING — CT CT HEAD W/O CM
2 series · 16 of 30 positions shown, 20 images · non-contrast
Comparison: 01/06/2015.

CLINICAL DATA: Altered mental status.  Normal yesterday.

EXAM:
CT HEAD WITHOUT CONTRAST
TECHNIQUE: Contiguous axial images were obtained from the base of the skull
through the vertex without intravenous contrast.

[Series 2: head w/o · axial · non-contrast · 0.45mm/px · z∈[-156,-21]mm · 13 of 33 slices shown, 17 images]
[im 3/33  brain]
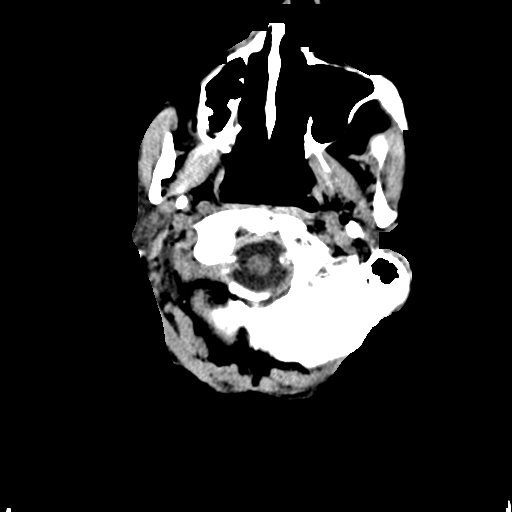
[im 3/33  bone]
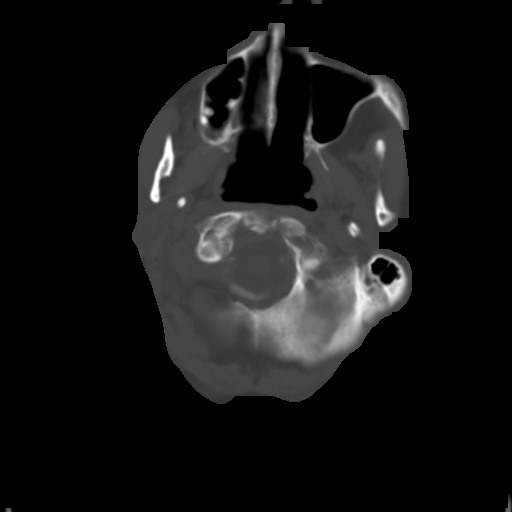
[im 5/33  brain]
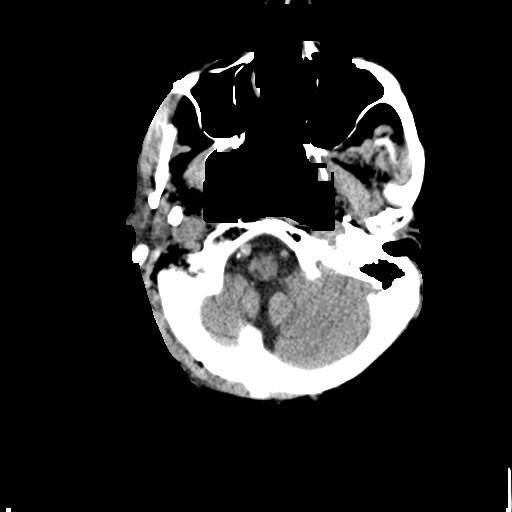
[im 7/33  brain]
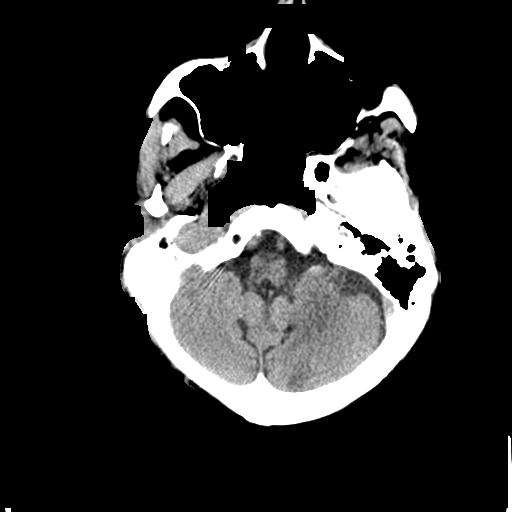
[im 10/33  brain]
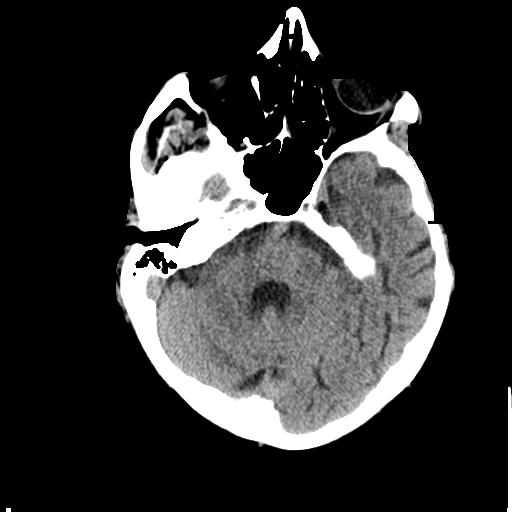
[im 12/33  brain]
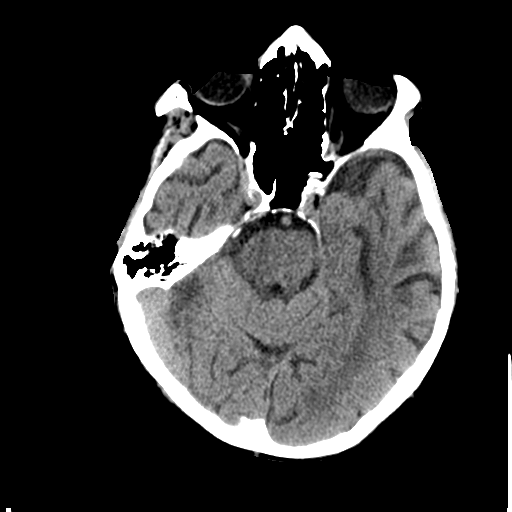
[im 12/33  bone]
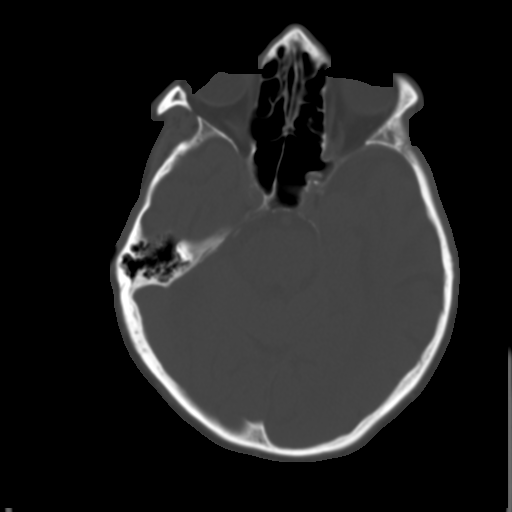
[im 14/33  brain]
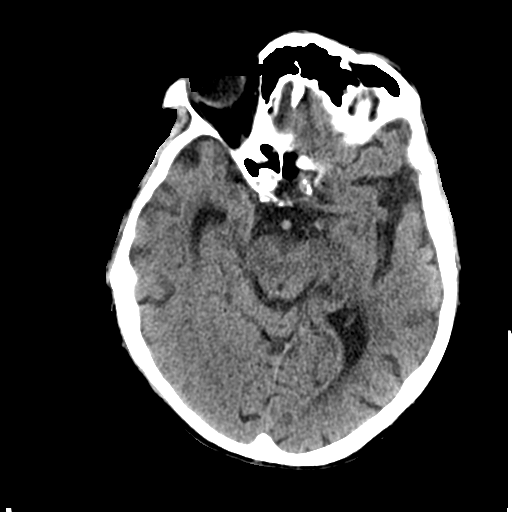
[im 17/33  brain]
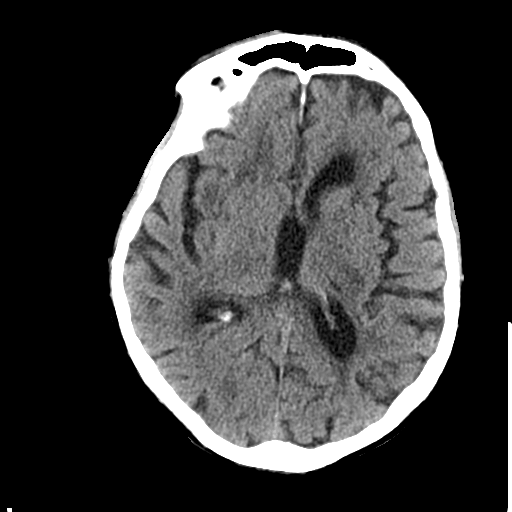
[im 19/33  brain]
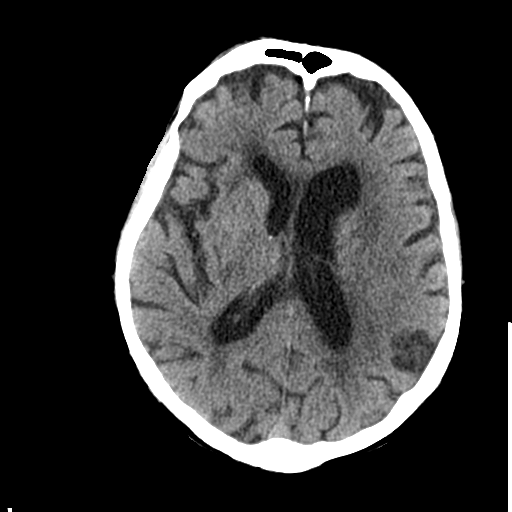
[im 21/33  brain]
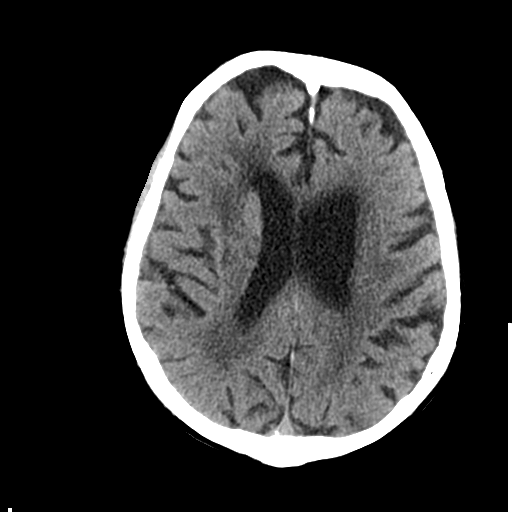
[im 21/33  bone]
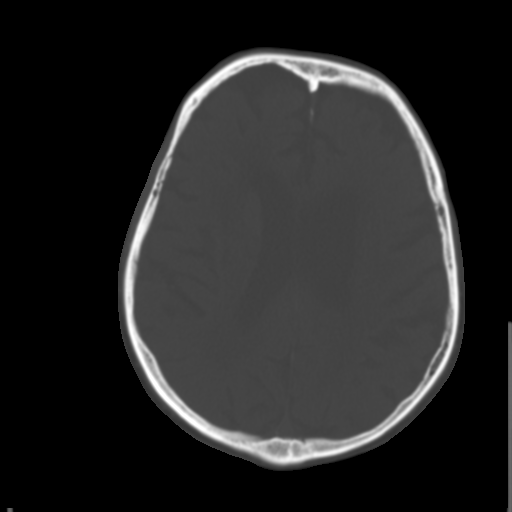
[im 23/33  brain]
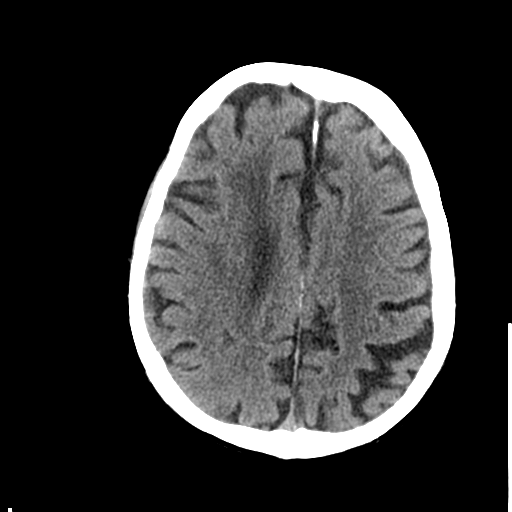
[im 26/33  brain]
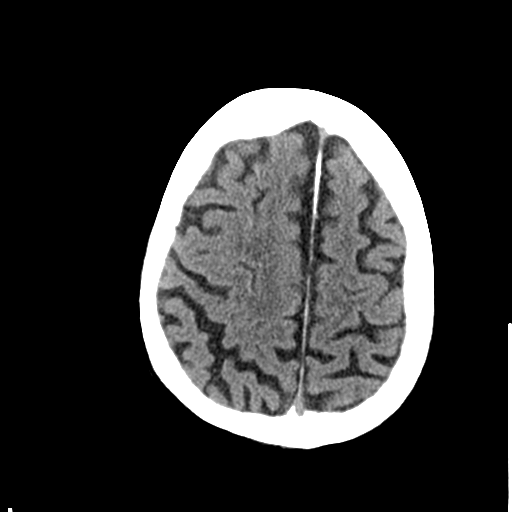
[im 28/33  brain]
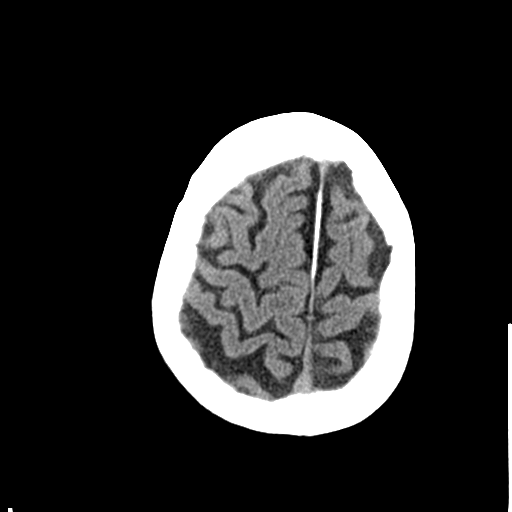
[im 30/33  brain]
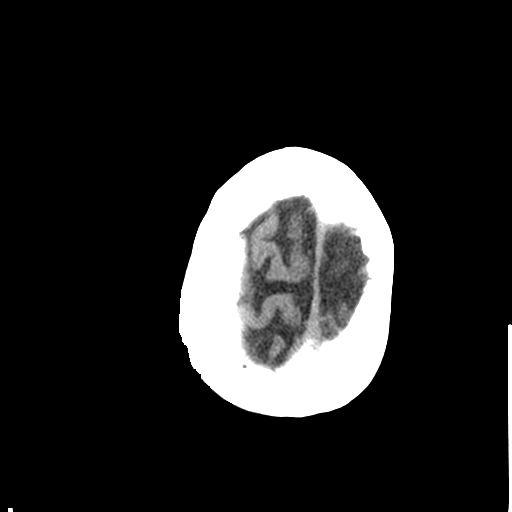
[im 30/33  bone]
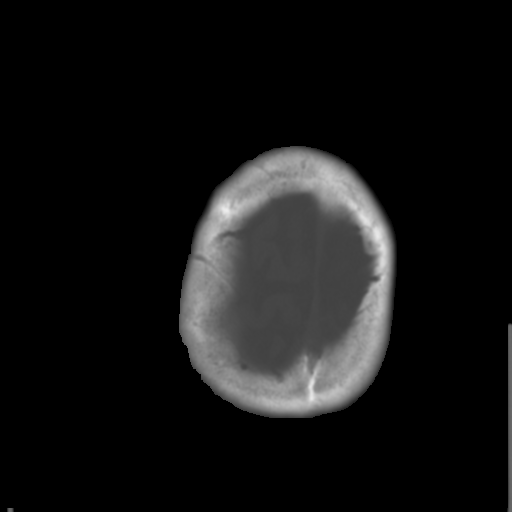

[Series 3: bone windows · axial · 0.45mm/px · z∈[-156,-111]mm · 3 of 33 slices shown]
[im 3/33  bone]
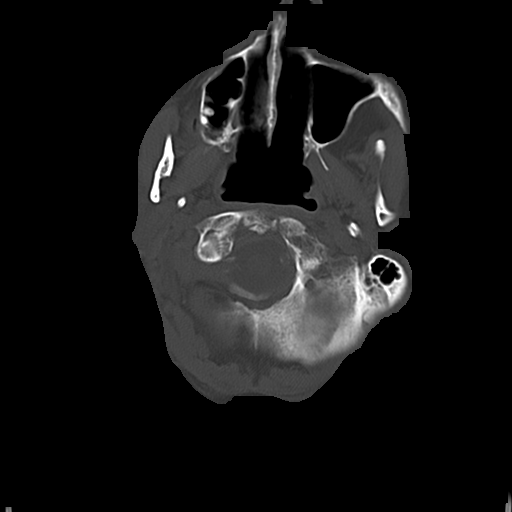
[im 7/33  bone]
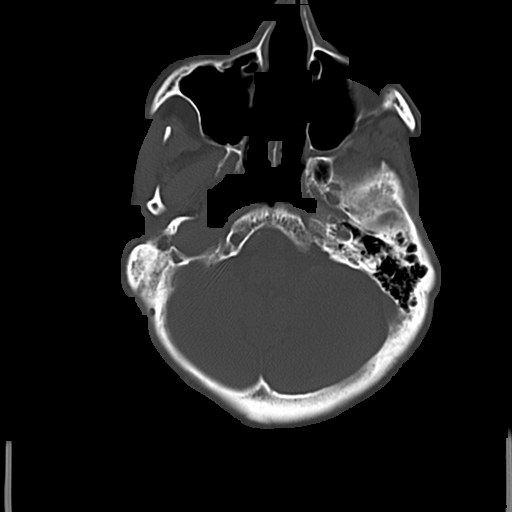
[im 12/33  bone]
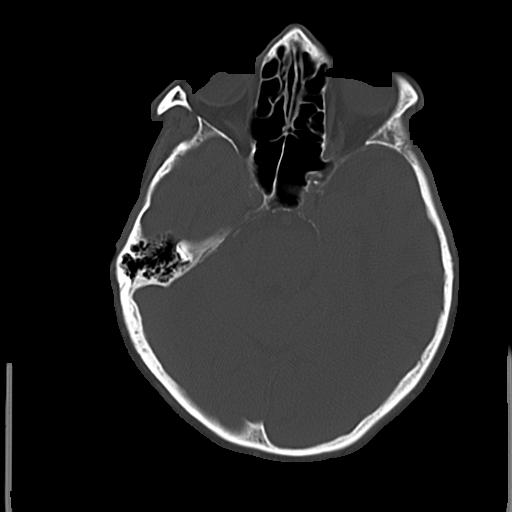

[16 of 30 positions shown; findings below may reference images not displayed]

FINDINGS: Diffusely enlarged ventricles and subarachnoid spaces. Patchy white
matter low density in both cerebral hemispheres and both cerebellar
hemispheres without significant change. Patchy low density is again
demonstrated in the medulla, pons and midbrain. This appears more
pronounced in the midbrain on the right today. No intracranial
hemorrhage or mass lesion. Unremarkable bones and included paranasal
sinuses.
IMPRESSION: 1. Mildly progressive ischemic changes in the mid brain on the
right.
2. Stable chronic ischemic changes in both cerebral hemispheres,
pons, medulla and both cerebellar hemispheres.
3. Stable atrophy.
4. No intracranial hemorrhage or mass effect.
